# Patient Record
Sex: Female | Born: 1990 | Race: Black or African American | Hispanic: No | Marital: Single | Smoking: Former smoker
Health system: Southern US, Community
[De-identification: ages and names within clinical notes are randomized; demographics above are authoritative.]

## PROBLEM LIST (undated history)

## (undated) ENCOUNTER — Inpatient Hospital Stay (HOSPITAL_COMMUNITY): Payer: Self-pay

## (undated) DIAGNOSIS — O99119 Other diseases of the blood and blood-forming organs and certain disorders involving the immune mechanism complicating pregnancy, unspecified trimester: Secondary | ICD-10-CM

## (undated) DIAGNOSIS — G709 Myoneural disorder, unspecified: Secondary | ICD-10-CM

## (undated) DIAGNOSIS — D649 Anemia, unspecified: Secondary | ICD-10-CM

## (undated) DIAGNOSIS — J40 Bronchitis, not specified as acute or chronic: Secondary | ICD-10-CM

## (undated) DIAGNOSIS — D696 Thrombocytopenia, unspecified: Secondary | ICD-10-CM

## (undated) HISTORY — PX: NO PAST SURGERIES: SHX2092

## (undated) HISTORY — DX: Other diseases of the blood and blood-forming organs and certain disorders involving the immune mechanism complicating pregnancy, unspecified trimester: D69.6

## (undated) HISTORY — DX: Thrombocytopenia, unspecified: O99.119

---

## 2015-12-30 ENCOUNTER — Emergency Department (HOSPITAL_COMMUNITY)
Admission: EM | Admit: 2015-12-30 | Discharge: 2015-12-30 | Disposition: A | Discharge status: Home / Self Care | Payer: Medicaid Other | Attending: Emergency Medicine | Admitting: Emergency Medicine

## 2015-12-30 ENCOUNTER — Emergency Department (HOSPITAL_COMMUNITY): Payer: Medicaid Other

## 2015-12-30 ENCOUNTER — Encounter (HOSPITAL_COMMUNITY): Payer: Self-pay | Admitting: Neurology

## 2015-12-30 DIAGNOSIS — O26899 Other specified pregnancy related conditions, unspecified trimester: Secondary | ICD-10-CM

## 2015-12-30 DIAGNOSIS — O210 Mild hyperemesis gravidarum: Secondary | ICD-10-CM | POA: Diagnosis not present

## 2015-12-30 DIAGNOSIS — R109 Unspecified abdominal pain: Secondary | ICD-10-CM

## 2015-12-30 DIAGNOSIS — O209 Hemorrhage in early pregnancy, unspecified: Secondary | ICD-10-CM | POA: Diagnosis present

## 2015-12-30 DIAGNOSIS — O26851 Spotting complicating pregnancy, first trimester: Secondary | ICD-10-CM

## 2015-12-30 DIAGNOSIS — O219 Vomiting of pregnancy, unspecified: Secondary | ICD-10-CM

## 2015-12-30 DIAGNOSIS — Z3A01 Less than 8 weeks gestation of pregnancy: Secondary | ICD-10-CM | POA: Insufficient documentation

## 2015-12-30 DIAGNOSIS — O200 Threatened abortion: Secondary | ICD-10-CM | POA: Insufficient documentation

## 2015-12-30 LAB — HCG, QUANTITATIVE, PREGNANCY: hCG, Beta Chain, Quant, S: 94943 m[IU]/mL — ABNORMAL HIGH (ref <5)

## 2015-12-30 LAB — URINALYSIS, ROUTINE W REFLEX MICROSCOPIC
Glucose, UA: NEGATIVE mg/dL
Ketones, ur: 40 mg/dL — AB
LEUKOCYTES UA: NEGATIVE
NITRITE: NEGATIVE
PH: 6 (ref 5.0–8.0)
Protein, ur: 30 mg/dL — AB
SPECIFIC GRAVITY, URINE: 1.036 — AB (ref 1.005–1.030)

## 2015-12-30 LAB — BASIC METABOLIC PANEL
ANION GAP: 11 (ref 5–15)
BUN: 9 mg/dL (ref 6–20)
CHLORIDE: 104 mmol/L (ref 101–111)
CO2: 22 mmol/L (ref 22–32)
Calcium: 9.2 mg/dL (ref 8.9–10.3)
Creatinine, Ser: 0.7 mg/dL (ref 0.44–1.00)
GFR calc non Af Amer: >60 mL/min (ref >60)
Glucose, Bld: 83 mg/dL (ref 65–99)
POTASSIUM: 3.8 mmol/L (ref 3.5–5.1)
Sodium: 137 mmol/L (ref 135–145)

## 2015-12-30 LAB — CBC WITH DIFFERENTIAL/PLATELET
BASOS ABS: 0 10*3/uL (ref 0.0–0.1)
BASOS PCT: 0 %
Eosinophils Absolute: 0 10*3/uL (ref 0.0–0.7)
Eosinophils Relative: 1 %
HEMATOCRIT: 36.1 % (ref 36.0–46.0)
HEMOGLOBIN: 11.3 g/dL — AB (ref 12.0–15.0)
Lymphocytes Relative: 24 %
Lymphs Abs: 0.9 10*3/uL (ref 0.7–4.0)
MCH: 25.5 pg — ABNORMAL LOW (ref 26.0–34.0)
MCHC: 31.3 g/dL (ref 30.0–36.0)
MCV: 81.5 fL (ref 78.0–100.0)
MONOS PCT: 11 %
Monocytes Absolute: 0.4 10*3/uL (ref 0.1–1.0)
NEUTROS ABS: 2.4 10*3/uL (ref 1.7–7.7)
NEUTROS PCT: 64 %
Platelets: 124 10*3/uL — ABNORMAL LOW (ref 150–400)
RBC: 4.43 MIL/uL (ref 3.87–5.11)
RDW: 15.6 % — ABNORMAL HIGH (ref 11.5–15.5)
WBC: 3.8 10*3/uL — ABNORMAL LOW (ref 4.0–10.5)

## 2015-12-30 LAB — I-STAT BETA HCG BLOOD, ED (MC, WL, AP ONLY)

## 2015-12-30 LAB — URINE MICROSCOPIC-ADD ON

## 2015-12-30 LAB — WET PREP, GENITAL
Sperm: NONE SEEN
Trich, Wet Prep: NONE SEEN
YEAST WET PREP: NONE SEEN

## 2015-12-30 MED ORDER — PROMETHAZINE HCL 12.5 MG PO TABS: 12.5000 mg | ORAL_TABLET | Freq: Four times a day (QID) | ORAL | Status: DC | PRN

## 2015-12-30 MED ORDER — SODIUM CHLORIDE 0.9 % IV SOLN
INTRAVENOUS | Status: DC
  Administered 2015-12-30: 16:00:00 via INTRAVENOUS

## 2015-12-30 MED ORDER — METOCLOPRAMIDE HCL 5 MG/ML IJ SOLN
10.0000 mg | Freq: Once | INTRAMUSCULAR | Status: AC
  Administered 2015-12-30: 10 mg via INTRAVENOUS
  Filled 2015-12-30: qty 2

## 2015-12-30 NOTE — ED Provider Notes (Signed)
CSN: [id number]     Arrival date & time 12/30/15  1258 History  By signing my name below, I, [name], attest that this documentation has been prepared under the direction and in the presence of Kerrie BuffaloHope Neese, NP Electronically Signed: [name], ED Scribe. 12/30/2015. 3:53 PM.   Chief Complaint  Patient presents with  . Vaginal Bleeding      Patient is a 25 y.o. female presenting with vaginal bleeding. The history is provided by the patient. No language interpreter was used.  Vaginal Bleeding Quality:  Heavier than menses Severity:  Moderate Onset quality:  Sudden Duration:  1 day Timing:  Constant Progression:  Unchanged Chronicity:  New Menstrual history:  Missed period Possible pregnancy: yes   Context: at rest   Relieved by:  None tried Worsened by:  Nothing tried Ineffective treatments:  None tried Associated symptoms: abdominal pain, back pain and nausea   Associated symptoms: no dysuria   Risk factors: no new sexual partner, no STD and no STD exposure     HPI Comments: Barbara Cox is a 25 y.o. female who presents to the Emergency Department complaining of vaginal bleeding onset yesterday.  She notes that she took a pregnancy test that returned positive with her LMP being 11/24/15. Pt states that her period that she had on 11/24/15 seemed similar to her menstrual initially, but increased in bleeding. Pt notes that this is her 3rd pregnancy, and she has one 23 year old child. Pt has had one abortion following her first pregnancy. Pt notes that she is going to get an abortion for this pregnancy, due to her having intense pain with this pregnancy. Pt notes that she had used pills and nexplanon as her contraceptive measures. She notes that she plans to have contraceptive following her upcoming abortion. Pt has been with her current sexual partner for 6 months and denies and PMHx of STDs.  She states that she is having associated symptoms of sharp moderate left abdominal pain  radiating to her back, pressure while urinating, vomiting 5-6 episodes daily x 1 week, and nausea. She states that she has not tried any medications for the relief for her symptoms. She denies frequency, urgency, dysuria, and any other symptoms. Denies PMHx but she takes bipolar and depression medications. Pt doesn't have a doctor currently, due to just moving here. Pt last pap smear 1 year ago.    History reviewed. No pertinent past medical history. History reviewed. No pertinent past surgical history. No family history on file. Social History  Substance Use Topics  . Smoking status: Never Smoker   . Smokeless tobacco: None  . Alcohol Use: No   OB History    No data available     Review of Systems  Gastrointestinal: Positive for nausea, vomiting and abdominal pain.  Genitourinary: Positive for vaginal bleeding. Negative for dysuria, urgency and frequency.  Musculoskeletal: Positive for back pain.  All other systems reviewed and are negative.     Allergies  Peanuts  Home Medications   Prior to Admission medications   Medication Sig Start Date End Date Taking? Authorizing Provider  promethazine (PHENERGAN) 12.5 MG tablet Take 1 tablet (12.5 mg total) by mouth every 6 (six) hours as needed for nausea or vomiting. 12/30/15   Hope Orlene OchM Neese, NP   BP 102/66 mmHg  Pulse 68  Temp(Src) 99.1 F (37.3 C) (Oral)  Resp 18  SpO2 100%  LMP 11/24/2015 Physical Exam  Constitutional: She is oriented to person, place, and time.  She appears well-developed and well-nourished. No distress.  HENT:  Head: Normocephalic and atraumatic.  Right Ear: Tympanic membrane, external ear and ear canal normal.  Left Ear: Tympanic membrane, external ear and ear canal normal.  Mouth/Throat: Uvula is midline, oropharynx is clear and moist and mucous membranes are normal. No posterior oropharyngeal edema or posterior oropharyngeal erythema.  Eyes: EOM are normal.  Neck: Neck supple.  Cardiovascular: Normal  rate, regular rhythm and normal heart sounds.  Exam reveals no gallop and no friction rub.   No murmur heard. Pulmonary/Chest: Effort normal and breath sounds normal. No respiratory distress. She has no wheezes. She has no rales.  Abdominal: Soft. Bowel sounds are normal. There is tenderness in the left upper quadrant and left lower quadrant. There is guarding. There is no CVA tenderness.  Tenderness in the LLQ and LUQ. Guarding noted.   Genitourinary:  Chaperone present for exam External genitalia without lesions, bloody d/c vaginal vault, positive CMT, left adnexal tenderness.   Musculoskeletal: Normal range of motion.  Neurological: She is alert and oriented to person, place, and time.  Skin: Skin is warm and dry.  Psychiatric: She has a normal mood and affect. Her behavior is normal.  Nursing note and vitals reviewed.   ED Course  Procedures (including critical care time) DIAGNOSTIC STUDIES: Oxygen Saturation is 100% on RA, nl by my interpretation.    COORDINATION OF CARE: 3:50 PM Discussed treatment plan with pt at bedside which includes labs, UA, pelvic exam and pt agreed to plan. IV fluids and medication for nausea.   Diff Dx ectopic pregnancy vs SAB vs IUP Doubt torsion or appendicitis as WBC 3.8 and pain is on the left.   Labs Review Labs Reviewed  WET PREP, GENITAL - Abnormal; Notable for the following:    Clue Cells Wet Prep HPF POC PRESENT (*)    WBC, Wet Prep HPF POC MANY (*)    All other components within normal limits  CBC WITH DIFFERENTIAL/PLATELET - Abnormal; Notable for the following:    WBC 3.8 (*)    Hemoglobin 11.3 (*)    MCH 25.5 (*)    RDW 15.6 (*)    Platelets 124 (*)    All other components within normal limits  URINALYSIS, ROUTINE W REFLEX MICROSCOPIC (NOT AT [facility]) - Abnormal; Notable for the following:    Specific Gravity, Urine 1.036 (*)    Hgb urine dipstick LARGE (*)    Bilirubin Urine SMALL (*)    Ketones, ur 40 (*)    Protein, ur 30 (*)     All other components within normal limits  URINE MICROSCOPIC-ADD ON - Abnormal; Notable for the following:    Squamous Epithelial / LPF 6-30 (*)    Bacteria, UA FEW (*)    All other components within normal limits  HCG, QUANTITATIVE, PREGNANCY - Abnormal; Notable for the following:    hCG, Beta Chain, Quant, S 81191 (*)    All other components within normal limits  I-STAT BETA HCG BLOOD, ED ([facility], [facility], [facility] ONLY) - Abnormal; Notable for the following:    I-stat hCG, quantitative >2000.0 (*)    All other components within normal limits  BASIC METABOLIC PANEL  RPR  HIV ANTIBODY (ROUTINE TESTING)  GC/CHLAMYDIA PROBE AMP ([place]) NOT AT [facility]    Imaging Review US Ob Comp Less 14 Wks  12/30/2015  CLINICAL DATA:  Left adnexal pain for 1 day.  The spotting. EXAM: OBSTETRIC <14 WK [place] AND TRANSVAGINAL OB US TECHNIQUE:  Both transabdominal and transvaginal ultrasound examinations were performed for complete evaluation of the gestation as well as the maternal uterus, adnexal regions, and pelvic cul-de-sac. Transvaginal technique was performed to assess early pregnancy. COMPARISON:  None. FINDINGS: Intrauterine gestational sac: Visualized/normal in shape. Yolk sac:  Present. Embryo:  Present. Cardiac Activity: Present. Heart Rate: 140  bpm CRL:  7.4  mm   6 w   for d                  [place] EDC: 08/20/2016 Subchorionic hemorrhage:  None visualized. Maternal uterus/adnexae: Trace pelvic free fluid. IMPRESSION: Single living intrauterine gestation as above. Electronically Signed   By: Sebastian Ache M.D.   On: 12/30/2015 18:13   US Ob Transvaginal  12/30/2015  CLINICAL DATA:  Left adnexal pain for 1 day.  The spotting. EXAM: OBSTETRIC <14 WK [place] AND TRANSVAGINAL OB US TECHNIQUE: Both transabdominal and transvaginal ultrasound examinations were performed for complete evaluation of the gestation as well as the maternal uterus, adnexal regions, and pelvic cul-de-sac. Transvaginal technique was performed to assess early  pregnancy. COMPARISON:  None. FINDINGS: Intrauterine gestational sac: Visualized/normal in shape. Yolk sac:  Present. Embryo:  Present. Cardiac Activity: Present. Heart Rate: 140  bpm CRL:  7.4  mm   6 w   for d                  [place] EDC: 08/20/2016 Subchorionic hemorrhage:  None visualized. Maternal uterus/adnexae: Trace pelvic free fluid. IMPRESSION: Single living intrauterine gestation as above. Electronically Signed   By: Sebastian Ache M.D.   On: 12/30/2015 18:13   I have personally reviewed and evaluated these images and lab results as part of my medical decision-making.   MDM  25 y.o. female with lower abdominal cramping and n/v in early pregnancy stable for d/c with viable IUP identified on ultrasound. Feeling better after fluids and medication for n/v. Will give Rx for n/v and patient to take tylenol as needed for pain and f/u with OB of choice. She will go to [facility] hospital for worsening symptoms.  Final diagnoses:  Abdominal pain in pregnancy  Nausea and vomiting in pregnancy prior to [redacted] weeks gestation  Threatened miscarriage in early pregnancy   I personally performed the services described in this documentation, which was scribed in my presence. The recorded information has been reviewed and is accurate.    [facility] Orlene Och, NP 12/30/15 [address] [place], NP 12/30/15 1610  Rolland Porter, MD 01/06/16 [id number]

## 2015-12-30 NOTE — ED Notes (Addendum)
LMP 2/13, took pregnancy test and was positive. Since yesterday has had heavy vaginal bleeding, c/o lower abd pain, is vomiting. Is still bleeding, but is not as heavy. No clots, has changed her pad twice so far today. Thinks she is about [redacted] weeks pregnant. C/o bilateral  lower abd pain radiating around to her back.

## 2015-12-31 LAB — GC/CHLAMYDIA PROBE AMP (~~LOC~~) NOT AT ARMC
CHLAMYDIA, DNA PROBE: NEGATIVE
NEISSERIA GONORRHEA: NEGATIVE

## 2015-12-31 LAB — HIV ANTIBODY (ROUTINE TESTING W REFLEX): HIV Screen 4th Generation wRfx: NONREACTIVE

## 2015-12-31 LAB — RPR: RPR: NONREACTIVE

## 2016-12-11 ENCOUNTER — Emergency Department (HOSPITAL_COMMUNITY)
Admission: EM | Admit: 2016-12-11 | Discharge: 2016-12-11 | Disposition: A | Discharge status: Home / Self Care | Payer: Medicaid Other | Attending: Emergency Medicine | Admitting: Emergency Medicine

## 2016-12-11 ENCOUNTER — Encounter (HOSPITAL_COMMUNITY): Payer: Self-pay | Admitting: Emergency Medicine

## 2016-12-11 DIAGNOSIS — Z5321 Procedure and treatment not carried out due to patient leaving prior to being seen by health care provider: Secondary | ICD-10-CM | POA: Diagnosis not present

## 2016-12-11 DIAGNOSIS — T7840XA Allergy, unspecified, initial encounter: Secondary | ICD-10-CM | POA: Diagnosis not present

## 2016-12-11 NOTE — ED Triage Notes (Signed)
Pt reports having a peanut allergy and 2 hours ago ate a granola bar with peanuts in it. Pt used friends EPI pen at 520. Pt states she feels a sharp pain in throat. No wheezing or anigoedema noted. Able to speech in complete sentences.

## 2016-12-11 NOTE — ED Notes (Signed)
Pt still not seen in the ED lobby. LWBS

## 2016-12-11 NOTE — ED Notes (Signed)
Pt stated she needed to go and check on her son. This RN explained the importance of staying and being monitored after having her EPI pen and eating peanuts. I informed pt I would go and check on a room for her and come back to get her. On arrival back to waiting area pt is not in lobby. Will continue to look for pt. Pt may have LWBS.

## 2018-04-19 ENCOUNTER — Encounter (HOSPITAL_COMMUNITY): Payer: Self-pay | Admitting: *Deleted

## 2018-04-19 ENCOUNTER — Other Ambulatory Visit: Payer: Self-pay

## 2018-04-19 ENCOUNTER — Emergency Department (HOSPITAL_COMMUNITY)
Admission: EM | Admit: 2018-04-19 | Discharge: 2018-04-19 | Disposition: A | Discharge status: Home / Self Care | Payer: Medicaid Other | Attending: Emergency Medicine | Admitting: Emergency Medicine

## 2018-04-19 DIAGNOSIS — M545 Low back pain, unspecified: Secondary | ICD-10-CM

## 2018-04-19 DIAGNOSIS — Z3A01 Less than 8 weeks gestation of pregnancy: Secondary | ICD-10-CM

## 2018-04-19 DIAGNOSIS — O26891 Other specified pregnancy related conditions, first trimester: Secondary | ICD-10-CM | POA: Insufficient documentation

## 2018-04-19 DIAGNOSIS — O9989 Other specified diseases and conditions complicating pregnancy, childbirth and the puerperium: Secondary | ICD-10-CM | POA: Diagnosis not present

## 2018-04-19 LAB — URINALYSIS, ROUTINE W REFLEX MICROSCOPIC
Bilirubin Urine: NEGATIVE
Glucose, UA: NEGATIVE mg/dL
Hgb urine dipstick: NEGATIVE
Ketones, ur: NEGATIVE mg/dL
Leukocytes, UA: NEGATIVE
NITRITE: NEGATIVE
PROTEIN: NEGATIVE mg/dL
SPECIFIC GRAVITY, URINE: 1.016 (ref 1.005–1.030)
pH: 6 (ref 5.0–8.0)

## 2018-04-19 LAB — I-STAT BETA HCG BLOOD, ED (MC, WL, AP ONLY): HCG, QUANTITATIVE: 826.4 m[IU]/mL — AB (ref <5)

## 2018-04-19 MED ORDER — PRENATAL COMPLETE 14-0.4 MG PO TABS: 1.0000 | ORAL_TABLET | Freq: Every day | ORAL | 0 refills | Status: DC

## 2018-04-19 NOTE — ED Triage Notes (Addendum)
Pt is here for feeling sick.  Pt reports lower back pain since last week and nausea and exhaustion.  No pain or burning with urination.  No fever or chills.  No weakness or numbness.  Pt reports that she is pregnant, confirmed by home pregnancy test.  LMP last month.

## 2018-04-19 NOTE — ED Provider Notes (Signed)
[facility] EMERGENCY DEPARTMENT Provider Note   CSN: [id number] Arrival date & time: 04/19/18  1207     History   Chief Complaint Chief Complaint  Patient presents with  . Illness    HPI Barbara Cox is a 27 y.o. female.  HPI   27 year old female presents today with complaints of right lower back pain. Patient notes approximately one month ago she was moving furniture around her house. Shortly after she developed pain in the right lower lumbar region. She notes pain does not radiate, denies any abdominal pain or fevers, denies any urinary symptoms. Patient also reports she is recently pregnant with the last mesh cycle on 11/20/2017. She denies any vaginal bleeding or discharge. Patient she was using BC powders as needed for pain.    History reviewed. No pertinent past medical history.  There are no active problems to display for this patient.   History reviewed. No pertinent surgical history.   OB History    Gravida  1   Para      Term      Preterm      AB      Living        SAB      TAB      Ectopic      Multiple      Live Births               Home Medications    Prior to Admission medications   Medication Sig Start Date End Date Taking? Authorizing Provider  Prenatal Vit-Fe Fumarate-FA (PRENATAL COMPLETE) 14-0.4 MG TABS Take 1 tablet by mouth daily. 04/19/18   Rameses Ou, Tinnie Gens, PA-C  promethazine (PHENERGAN) 12.5 MG tablet Take 1 tablet (12.5 mg total) by mouth every 6 (six) hours as needed for nausea or vomiting. 12/30/15   Janne Napoleon, NP    Family History No family history on file.  Social History Social History   Tobacco Use  . Smoking status: Never Smoker  . Smokeless tobacco: Never Used  Substance Use Topics  . Alcohol use: No  . Drug use: Never     Allergies   Peanuts [peanut oil]   Review of Systems Review of Systems  All other systems reviewed and are negative.  Physical Exam Updated Vital  Signs BP 110/66 (BP Location: Right Arm)   Pulse 81   Temp 98.9 F (37.2 C) (Oral)   Resp 16   Ht 5\' 1"  (1.549 m)   Wt 72.6 kg (160 lb)   LMP 03/20/2018 Comment: pt states that her perion 6/10 was light. one before 5/16  SpO2 100%   BMI 30.23 kg/m   Physical Exam  Constitutional: She is oriented to person, place, and time. She appears well-developed and well-nourished.  HENT:  Head: Normocephalic and atraumatic.  Eyes: Pupils are equal, round, and reactive to light. Conjunctivae are normal. Right eye exhibits no discharge. Left eye exhibits no discharge. No scleral icterus.  Neck: Normal range of motion. No JVD present. No tracheal deviation present.  Pulmonary/Chest: Effort normal. No stridor.  Abdominal:  Abdomen soft nontender  Musculoskeletal:  Exquisite tenderness palpation of right lateral lumbar musculature, distal sensation strength function intact  Neurological: She is alert and oriented to person, place, and time. Coordination normal.  Psychiatric: She has a normal mood and affect. Her behavior is normal. Judgment and thought content normal.  Nursing note and vitals reviewed.   ED Treatments / Results  Labs (all labs ordered are  listed, but only abnormal results are displayed) Labs Reviewed  URINALYSIS, ROUTINE W REFLEX MICROSCOPIC - Abnormal; Notable for the following components:      Result Value   APPearance HAZY (*)    All other components within normal limits  I-STAT BETA HCG BLOOD, ED ([facility], [facility], [facility] ONLY) - Abnormal; Notable for the following components:   I-stat hCG, quantitative 826.4 (*)    All other components within normal limits  I-STAT BETA HCG BLOOD, ED ([facility], [facility], [facility] ONLY)    EKG None  Radiology No results found.  Procedures Procedures (including critical care time)  Medications Ordered in ED Medications - No data to display   Initial Impression / Assessment and Plan / ED Course  I have reviewed the triage vital signs and the nursing  notes.  Pertinent labs & imaging results that were available during my care of the patient were reviewed by me and considered in my medical decision making (see chart for details).  Clinical Course as of Apr 19 1504  Wed Apr 19, 2018  1409 Comment 3:        [NL]  1409 Comment 3:        [NL]    Clinical Course User Index [NL] Laurice RecordLandry, Naomi P, Student-PA    Labs: I-STAT beta-hCG, urinalysis  Imaging:  Consults:  Therapeutics:  Discharge Meds:   Assessment/Plan: 27 year old female presents today with likely muscular back pain. Patient has had this for approximately one month, this was prior to her pregnancy. Patient has elevation in hCG at 826 today, no abdominal pain, no vaginal bleeding or discharge. Patient started on prenatals encouraged to use Tylenol as needed for pain, follow up as an outpatient with OB. Patient verbalized understanding and agreement to today's plan.   Final Clinical Impressions(s) / ED Diagnoses   Final diagnoses:  Acute right-sided low back pain without sciatica  Less than [redacted] weeks gestation of pregnancy    ED Discharge Orders        Ordered    Prenatal Vit-Fe Fumarate-FA (PRENATAL COMPLETE) 14-0.4 MG TABS  Daily     04/19/18 1502       Eyvonne MechanicHedges, Honore Wipperfurth, PA-C 04/19/18 1506    Melene PlanFloyd, Dan, DO 04/19/18 1641

## 2018-04-19 NOTE — Discharge Instructions (Signed)
Please read attached information. If you experience any new or worsening signs or symptoms please return to the emergency room for evaluation. Please follow-up with your primary care provider or specialist as discussed. Please use medication prescribed only as directed and discontinue taking if you have any concerning signs or symptoms.   °

## 2018-05-17 ENCOUNTER — Encounter: Payer: Medicaid Other | Admitting: Obstetrics

## 2018-06-06 ENCOUNTER — Ambulatory Visit (INDEPENDENT_AMBULATORY_CARE_PROVIDER_SITE_OTHER): Payer: Medicaid Other | Admitting: Obstetrics

## 2018-06-06 ENCOUNTER — Other Ambulatory Visit (HOSPITAL_COMMUNITY)
Admission: RE | Admit: 2018-06-06 | Discharge: 2018-06-06 | Disposition: A | Discharge status: Home / Self Care | Payer: Medicaid Other | Source: Ambulatory Visit | Attending: Obstetrics | Admitting: Obstetrics

## 2018-06-06 ENCOUNTER — Encounter: Payer: Self-pay | Admitting: Obstetrics

## 2018-06-06 VITALS — BP 102/60 | HR 81 | Wt 163.7 lb

## 2018-06-06 DIAGNOSIS — Z3481 Encounter for supervision of other normal pregnancy, first trimester: Secondary | ICD-10-CM

## 2018-06-06 DIAGNOSIS — Z01419 Encounter for gynecological examination (general) (routine) without abnormal findings: Secondary | ICD-10-CM | POA: Diagnosis not present

## 2018-06-06 DIAGNOSIS — Z3A11 11 weeks gestation of pregnancy: Secondary | ICD-10-CM | POA: Insufficient documentation

## 2018-06-06 DIAGNOSIS — Z3480 Encounter for supervision of other normal pregnancy, unspecified trimester: Secondary | ICD-10-CM | POA: Diagnosis not present

## 2018-06-06 DIAGNOSIS — O219 Vomiting of pregnancy, unspecified: Secondary | ICD-10-CM

## 2018-06-06 DIAGNOSIS — N898 Other specified noninflammatory disorders of vagina: Secondary | ICD-10-CM

## 2018-06-06 MED ORDER — DOXYLAMINE-PYRIDOXINE 10-10 MG PO TBEC: DELAYED_RELEASE_TABLET | ORAL | 5 refills | Status: DC

## 2018-06-06 NOTE — Progress Notes (Signed)
Subjective:  Barbara Cox is a 27 y.o. G3P1011 at 3580w6d being seen today for ongoing prenatal care.  She is currently monitored for the following issues for this low-risk pregnancy and has Supervision of other normal pregnancy, antepartum on their problem list.  Patient reports backache and round ligament-like pains.  Contractions: Irritability.  .   . Denies leaking of fluid.   The following portions of the patient's history were reviewed and updated as appropriate: allergies, current medications, past family history, past medical history, past social history, past surgical history and problem list. Problem list updated.  Objective:   Vitals:   06/06/18 1045  BP: 102/60  Pulse: 81  Weight: 163 lb 11.2 oz (74.3 kg)    Fetal Status: Fetal Heart Rate (bpm): 150         General:  Alert, oriented and cooperative. Patient is in no acute distress.  Skin: Skin is warm and dry. No rash noted.   Cardiovascular: Normal heart rate noted  Respiratory: Normal respiratory effort, no problems with respiration noted  Abdomen: Soft, gravid, appropriate for gestational age. Pain/Pressure: Present     Pelvic:  Cervical exam deferred        Extremities: Normal range of motion.  Edema: None  Mental Status: Normal mood and affect. Normal behavior. Normal judgment and thought content.   Urinalysis:      Assessment and Plan:  Pregnancy: G3P1011 at 2180w6d  1. Supervision of other normal pregnancy, antepartum Rx: - Cytology - PAP - Hemoglobinopathy evaluation - SMN1 Copy Number Analysis  2. Nausea and vomiting during pregnancy prior to [redacted] weeks gestation Rx: - Obstetric Panel, Including HIV - Culture, OB Urine - Doxylamine-Pyridoxine (DICLEGIS) 10-10 MG TBEC; 1 tab in AM, 1 tab mid afternoon 2 tabs at bedtime. Max dose 4 tabs daily.  Dispense: 100 tablet; Refill: 5  3. Vaginal discharge Rx: - Cervicovaginal ancillary only  Preterm labor symptoms and general obstetric precautions including but  not limited to vaginal bleeding, contractions, leaking of fluid and fetal movement were reviewed in detail with the patient. Please refer to After Visit Summary for other counseling recommendations.  Return in about 4 weeks (around 07/04/2018) for ROB.   Brock BadHarper, Charles A, MD

## 2018-06-06 NOTE — Patient Instructions (Signed)
Morning Sickness Morning sickness is when you feel sick to your stomach (nauseous) during pregnancy. This nauseous feeling may or may not come with vomiting. It often occurs in the morning but can be a problem any time of day. Morning sickness is most common during the first trimester, but it may continue throughout pregnancy. While morning sickness is unpleasant, it is usually harmless unless you develop severe and continual vomiting (hyperemesis gravidarum). This condition requires more intense treatment. What are the causes? The cause of morning sickness is not completely known but seems to be related to normal hormonal changes that occur in pregnancy. What increases the risk? You are at greater risk if you:  Experienced nausea or vomiting before your pregnancy.  Had morning sickness during a previous pregnancy.  Are pregnant with more than one baby, such as twins.  How is this treated? Do not use any medicines (prescription, over-the-counter, or herbal) for morning sickness without first talking to your health care provider. Your health care provider may prescribe or recommend:  Vitamin B6 supplements.  Anti-nausea medicines.  The herbal medicine ginger.  Follow these instructions at home:  Only take over-the-counter or prescription medicines as directed by your health care provider.  Taking multivitamins before getting pregnant can prevent or decrease the severity of morning sickness in most women.  Eat a piece of dry toast or unsalted crackers before getting out of bed in the morning.  Eat five or six small meals a day.  Eat dry and bland foods (rice, baked potato). Foods high in carbohydrates are often helpful.  Do not drink liquids with your meals. Drink liquids between meals.  Avoid greasy, fatty, and spicy foods.  Get someone to cook for you if the smell of any food causes nausea and vomiting.  If you feel nauseous after taking prenatal vitamins, take the vitamins at  night or with a snack.  Snack on protein foods (nuts, yogurt, cheese) between meals if you are hungry.  Eat unsweetened gelatins for desserts.  Wearing an acupressure wristband (worn for sea sickness) may be helpful.  Acupuncture may be helpful.  Do not smoke.  Get a humidifier to keep the air in your house free of odors.  Get plenty of fresh air. Contact a health care provider if:  Your home remedies are not working, and you need medicine.  You feel dizzy or lightheaded.  You are losing weight. Get help right away if:  You have persistent and uncontrolled nausea and vomiting.  You pass out (faint). This information is not intended to replace advice given to you by your health care provider. Make sure you discuss any questions you have with your health care provider. Document Released: 11/18/2006 Document Revised: 03/04/2016 Document Reviewed: 03/14/2013 Elsevier Interactive Patient Education  2017 Elsevier Inc.  Hyperemesis Gravidarum Hyperemesis gravidarum is a severe form of nausea and vomiting that happens during pregnancy. Hyperemesis is worse than morning sickness. It may cause you to have nausea or vomiting all day for many days. It may keep you from eating and drinking enough food and liquids. Hyperemesis usually occurs during the first half (the first 20 weeks) of pregnancy. It often goes away once a woman is in her second half of pregnancy. However, sometimes hyperemesis continues through an entire pregnancy. What are the causes? The cause of this condition is not known. It may be related to changes in chemicals (hormones) in the body during pregnancy, such as the high level of pregnancy hormone (human chorionic gonadotropin) or  the increase in the female sex hormone (estrogen). What are the signs or symptoms? Symptoms of this condition include:  Severe nausea and vomiting.  Nausea that does not go away.  Vomiting that does not allow you to keep any food  down.  Weight loss.  Body fluid loss (dehydration).  Having no desire to eat, or not liking food that you have previously enjoyed.  How is this diagnosed? This condition may be diagnosed based on:  A physical exam.  Your medical history.  Your symptoms.  Blood tests.  Urine tests.  How is this treated? This condition may be managed with medicine. If medicines to do not help relieve nausea and vomiting, you may need to receive fluids through an IV tube at the hospital. Follow these instructions at home:  Take over-the-counter and prescription medicines only as told by your health care provider.  Avoid iron pills and multivitamins that contain iron for the first 3-4 months of pregnancy. If you take prescription iron pills, do not stop taking them unless your health care provider approves.  Take the following actions to help prevent nausea and vomiting: ? In the morning, before getting out of bed, try eating a couple of dry crackers or a piece of toast. ? Avoid foods and smells that upset your stomach. Fatty and spicy foods may make nausea worse. ? Eat 5-6 small meals a day. ? Do not drink fluids while eating meals. Drink between meals. ? Eat or suck on things that have ginger in them. Ginger can help relieve nausea. ? Avoid food preparation. The smell of food can spoil your appetite or trigger nausea.  Follow instructions from your health care provider about eating or drinking restrictions.  For snacks, eat high-protein foods, such as cheese.  Keep all follow-up and pre-birth (prenatal) visits as told by your health care provider. This is important. Contact a health care provider if:  You have pain in your abdomen.  You have a severe headache.  You have vision problems.  You are losing weight. Get help right away if:  You cannot drink fluids without vomiting.  You vomit blood.  You have constant nausea and vomiting.  You are very weak.  You are very  thirsty.  You feel dizzy.  You faint.  You have a fever or other symptoms that last for more than 2-3 days.  You have a fever and your symptoms suddenly get worse. Summary  Hyperemesis gravidarum is a severe form of nausea and vomiting that happens during pregnancy.  Making some changes to your eating habits may help relieve nausea and vomiting.  This condition may be managed with medicine.  If medicines to do not help relieve nausea and vomiting, you may need to receive fluids through an IV tube at the hospital. This information is not intended to replace advice given to you by your health care provider. Make sure you discuss any questions you have with your health care provider. Document Released: 09/27/2005 Document Revised: 05/26/2016 Document Reviewed: 05/26/2016 Elsevier Interactive Patient Education  2017 Elsevier Inc.  Eating Plan for Hyperemesis Gravidarum Hyperemesis gravidarum is a severe form of morning sickness. Because this condition causes severe nausea and vomiting, it can lead to dehydration, malnutrition, and weight loss. One way to lessen the symptoms of nausea and vomiting is to follow the eating plan for hyperemesis gravidarum. It is often used along with prescribed medicines to control your symptoms. What can I do to relieve my symptoms? Listen to your body.  Everyone is different and has different preferences. Find what works best for you. Take any of the following actions that are helpful to you:  Eat and drink slowly.  Eat 5-6 small meals daily instead of 3 large meals.  Eat crackers before you get out of bed in the morning.  Try having a snack in the middle of the night.  Starchy foods are usually tolerated well. Examples include cereal, toast, bread, potatoes, pasta, rice, and pretzels.  Ginger may help with nausea. Add  tsp ground ginger to hot tea or choose ginger tea.  Try drinking 100% fruit juice or an electrolyte drink. An electrolyte drink  contains sodium, potassium, and chloride.  Continue to take your prenatal vitamins as told by your health care provider. If you are having trouble taking your prenatal vitamins, talk with your health care provider about different options.  Include at least 1 serving of protein with your meals and snacks. Protein options include meats or poultry, beans, nuts, eggs, and yogurt. Try eating a protein-rich snack before bed. Examples of these snacks include cheese and crackers or half of a peanut butter or [place] sandwich.  Consider eliminating foods that trigger your symptoms. These may include spicy foods, coffee, high-fat foods, very sweet foods, and acidic foods.  Try meals that have more protein combined with bland, salty, lower-fat, and dry foods, such as nuts, seeds, pretzels, crackers, and cereal.  Talk with your healthcare provider about starting a supplement of vitamin B6.  Have fluids that are cold, clear, and carbonated or sour. Examples include lemonade, ginger ale, lemon-lime soda, ice water, and sparkling water.  Try lemon or mint tea.  Try brushing your teeth or using a mouth rinse after meals.  What should I avoid to reduce my symptoms? Avoiding some of the following things may help reduce your symptoms.  Foods with strong smells. Try eating meals in well-ventilated areas that are free of odors.  Drinking water or other beverages with meals. Try not to drink anything during the 30 minutes before and after your meals.  Drinking more than 1 cup of fluid at a time. Sometimes using a straw helps.  Fried or high-fat foods, such as butter and cream sauces.  Spicy foods.  Skipping meals as best as you can. Nausea can be more intense on an empty stomach. If you cannot tolerate food at that time, do not force it. Try sucking on ice chips or other frozen items, and make up for missed calories later.  Lying down within 2 hours after eating.  Environmental triggers. These may  include smoky rooms, closed spaces, rooms with strong smells, warm or humid places, overly loud and noisy rooms, and rooms with motion or flickering lights.  Quick and sudden changes in your movement.  This information is not intended to replace advice given to you by your health care provider. Make sure you discuss any questions you have with your health care provider. Document Released: 07/25/2007 Document Revised: 05/26/2016 Document Reviewed: 04/27/2016 Elsevier Interactive Patient Education  [employer]2018 Elsevier Inc.
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
## 2018-06-06 NOTE — Addendum Note (Signed)
Addended by: Natale MilchSTALLING, BRITTANY D on: 06/06/2018 01:45 PM   Modules accepted: Orders

## 2018-06-07 ENCOUNTER — Other Ambulatory Visit: Payer: Self-pay | Admitting: Obstetrics

## 2018-06-07 DIAGNOSIS — B9689 Other specified bacterial agents as the cause of diseases classified elsewhere: Secondary | ICD-10-CM

## 2018-06-07 DIAGNOSIS — N760 Acute vaginitis: Principal | ICD-10-CM

## 2018-06-07 LAB — CERVICOVAGINAL ANCILLARY ONLY
BACTERIAL VAGINITIS: POSITIVE — AB
CANDIDA VAGINITIS: NEGATIVE
Chlamydia: NEGATIVE
NEISSERIA GONORRHEA: NEGATIVE
Trichomonas: NEGATIVE

## 2018-06-07 MED ORDER — CLINDAMYCIN HCL 300 MG PO CAPS: 300.0000 mg | ORAL_CAPSULE | Freq: Three times a day (TID) | ORAL | 0 refills | Status: DC

## 2018-06-08 LAB — CYTOLOGY - PAP: DIAGNOSIS: NEGATIVE

## 2018-06-09 LAB — URINE CULTURE, OB REFLEX

## 2018-06-09 LAB — CULTURE, OB URINE

## 2018-06-14 ENCOUNTER — Telehealth: Payer: Self-pay

## 2018-06-14 NOTE — Telephone Encounter (Signed)
Returned call, and advised of lab results.

## 2018-06-17 LAB — SMN1 COPY NUMBER ANALYSIS (SMA CARRIER SCREENING)

## 2018-06-17 LAB — OBSTETRIC PANEL, INCLUDING HIV
ANTIBODY SCREEN: NEGATIVE
BASOS: 0 %
Basophils Absolute: 0 10*3/uL (ref 0.0–0.2)
EOS (ABSOLUTE): 0 10*3/uL (ref 0.0–0.4)
EOS: 1 %
HEMATOCRIT: 34.4 % (ref 34.0–46.6)
HEP B S AG: NEGATIVE
HIV Screen 4th Generation wRfx: NONREACTIVE
Hemoglobin: 11.3 g/dL (ref 11.1–15.9)
IMMATURE GRANS (ABS): 0 10*3/uL (ref 0.0–0.1)
Immature Granulocytes: 0 %
LYMPHS: 20 %
Lymphocytes Absolute: 1 10*3/uL (ref 0.7–3.1)
MCH: 26.8 pg (ref 26.6–33.0)
MCHC: 32.8 g/dL (ref 31.5–35.7)
MCV: 82 fL (ref 79–97)
MONOCYTES: 13 %
Monocytes Absolute: 0.7 10*3/uL (ref 0.1–0.9)
Neutrophils Absolute: 3.2 10*3/uL (ref 1.4–7.0)
Neutrophils: 66 %
Platelets: 177 10*3/uL (ref 150–450)
RBC: 4.21 x10E6/uL (ref 3.77–5.28)
RDW: 17.4 % — ABNORMAL HIGH (ref 12.3–15.4)
RH TYPE: NEGATIVE
RPR Ser Ql: NONREACTIVE
RUBELLA: 1.63 {index} (ref >0.99)
WBC: 5 10*3/uL (ref 3.4–10.8)

## 2018-06-17 LAB — HEMOGLOBINOPATHY EVALUATION
HEMOGLOBIN F QUANTITATION: 0 % (ref 0.0–2.0)
HGB A: 97.7 % (ref 96.4–98.8)
HGB C: 0 %
HGB S: 0 %
HGB VARIANT: 0 %
Hemoglobin A2 Quantitation: 2.3 % (ref 1.8–3.2)

## 2018-07-04 ENCOUNTER — Ambulatory Visit (INDEPENDENT_AMBULATORY_CARE_PROVIDER_SITE_OTHER): Payer: Medicaid Other | Admitting: Obstetrics

## 2018-07-04 ENCOUNTER — Other Ambulatory Visit: Payer: Self-pay

## 2018-07-04 ENCOUNTER — Encounter (HOSPITAL_COMMUNITY): Payer: Self-pay

## 2018-07-04 ENCOUNTER — Encounter: Payer: Self-pay | Admitting: Obstetrics

## 2018-07-04 ENCOUNTER — Inpatient Hospital Stay (HOSPITAL_COMMUNITY)
Admission: AD | Admit: 2018-07-04 | Discharge: 2018-07-04 | Disposition: A | Discharge status: Home / Self Care | Payer: Medicaid Other | Source: Ambulatory Visit | Attending: Family Medicine | Admitting: Family Medicine

## 2018-07-04 VITALS — BP 116/62 | HR 76 | Wt 160.2 lb

## 2018-07-04 DIAGNOSIS — Z6791 Unspecified blood type, Rh negative: Secondary | ICD-10-CM | POA: Insufficient documentation

## 2018-07-04 DIAGNOSIS — O4692 Antepartum hemorrhage, unspecified, second trimester: Secondary | ICD-10-CM | POA: Diagnosis not present

## 2018-07-04 DIAGNOSIS — O209 Hemorrhage in early pregnancy, unspecified: Secondary | ICD-10-CM | POA: Insufficient documentation

## 2018-07-04 DIAGNOSIS — O26852 Spotting complicating pregnancy, second trimester: Secondary | ICD-10-CM | POA: Diagnosis present

## 2018-07-04 DIAGNOSIS — O99332 Smoking (tobacco) complicating pregnancy, second trimester: Secondary | ICD-10-CM | POA: Diagnosis not present

## 2018-07-04 DIAGNOSIS — Z3A15 15 weeks gestation of pregnancy: Secondary | ICD-10-CM | POA: Diagnosis not present

## 2018-07-04 DIAGNOSIS — G8929 Other chronic pain: Secondary | ICD-10-CM

## 2018-07-04 DIAGNOSIS — Z3480 Encounter for supervision of other normal pregnancy, unspecified trimester: Secondary | ICD-10-CM

## 2018-07-04 DIAGNOSIS — O26899 Other specified pregnancy related conditions, unspecified trimester: Secondary | ICD-10-CM | POA: Insufficient documentation

## 2018-07-04 DIAGNOSIS — O26893 Other specified pregnancy related conditions, third trimester: Secondary | ICD-10-CM | POA: Diagnosis not present

## 2018-07-04 DIAGNOSIS — M545 Low back pain: Secondary | ICD-10-CM

## 2018-07-04 LAB — URINALYSIS, ROUTINE W REFLEX MICROSCOPIC
Bacteria, UA: NONE SEEN
Bilirubin Urine: NEGATIVE
Glucose, UA: NEGATIVE mg/dL
KETONES UR: 20 mg/dL — AB
Leukocytes, UA: NEGATIVE
Nitrite: NEGATIVE
PH: 6 (ref 5.0–8.0)
Protein, ur: NEGATIVE mg/dL
Specific Gravity, Urine: 1.014 (ref 1.005–1.030)

## 2018-07-04 LAB — ABO/RH: ABO/RH(D): O NEG

## 2018-07-04 MED ORDER — COMFORT FIT MATERNITY SUPP SM MISC: 0 refills | Status: DC

## 2018-07-04 MED ORDER — RHO D IMMUNE GLOBULIN 1500 UNIT/2ML IJ SOSY
300.0000 ug | PREFILLED_SYRINGE | Freq: Once | INTRAMUSCULAR | Status: AC
  Administered 2018-07-04: 300 ug via INTRAMUSCULAR
  Filled 2018-07-04: qty 2

## 2018-07-04 MED ORDER — VITAFOL ULTRA 29-0.6-0.4-200 MG PO CAPS: 1.0000 | ORAL_CAPSULE | Freq: Every day | ORAL | 4 refills | Status: DC

## 2018-07-04 NOTE — MAU Note (Signed)
Went to appt this morning, everything was fine.  Got some bad news.  Went to the bathroom later and had some bleeding, passed a small clot. Still bleeding. Little pains in her back, not a new problem.

## 2018-07-04 NOTE — Discharge Instructions (Signed)
Vaginal Bleeding During Pregnancy, Second Trimester A small amount of bleeding (spotting) from the vagina is common in pregnancy. Sometimes the bleeding is normal and is not a problem, and sometimes it is a sign of something serious. Be sure to tell your doctor about any bleeding from your vagina right away. Follow these instructions at home:  Watch your condition for any changes.  Follow your doctor's instructions about how active you can be.  If you are on bed rest: ? You may need to stay in bed and only get up to use the bathroom. ? You may be allowed to do some activities. ? If you need help, make plans for someone to help you.  Write down: ? The number of pads you use each day. ? How often you change pads. ? How soaked (saturated) your pads are.  Do not use tampons.  Do not douche.  Do not have sex or orgasms until your doctor says it is okay.  If you pass any tissue from your vagina, save the tissue so you can show it to your doctor.  Only take medicines as told by your doctor.  Do not take aspirin because it can make you bleed.  Do not exercise, lift heavy weights, or do any activities that take a lot of energy and effort unless your doctor says it is okay.  Keep all follow-up visits as told by your doctor. Contact a doctor if:  You bleed from your vagina.  You have cramps.  You have labor pains.  You have a fever that does not go away after you take medicine. Get help right away if:  You have very bad cramps in your back or belly (abdomen).  You have contractions.  You have chills.  You pass large clots or tissue from your vagina.  You bleed more.  You feel light-headed or weak.  You pass out (faint).  You are leaking fluid or have a gush of fluid from your vagina. This information is not intended to replace advice given to you by your health care provider. Make sure you discuss any questions you have with your health care provider. Document  Released: 02/11/2014 Document Revised: 03/04/2016 Document Reviewed: 06/04/2013 Elsevier Interactive Patient Education  2018 Elsevier Inc.  

## 2018-07-04 NOTE — MAU Note (Signed)
PT called RN to bathroom just before she was being discharged to see some scant, bright red blood on toilet paper. Pt was concerned about this so provider Judie Petit(M. Denyse AmassBhambri) was notified. Provider said that bleeding could be d/t recent pelvic and SVE and to doppler patient for reassurance. Also told writer to reinforce precautions for bleeding in 2nd trimester of pregnancy. Writer did doppler and FHR was 155-157. Spoke w/patient further about bleeding because she stated she was extremely scared about what it means. Told patient to wear a regular menstrual pad for next few days to be able to quantify bleeding. Told her to return to MAU if she has any signs/symptoms regarding bleeding that we talked about at discharge and that provider discussed with her. Patient seemed slightly reassured by this but still expressed worry that she "had done something" to cause bleeding.  Pt left with instructions which she said she did understand.

## 2018-07-04 NOTE — Progress Notes (Signed)
Subjective:  Barbara MoralesMelony Cox is a 27 y.o. G3P1011 at 3475w6d being seen today for ongoing prenatal care.  She is currently monitored for the following issues for this low-risk pregnancy and has Supervision of other normal pregnancy, antepartum on their problem list.  Patient reports no complaints.  Contractions: Not present. Vag. Bleeding: None.  Movement: Present. Denies leaking of fluid.   The following portions of the patient's history were reviewed and updated as appropriate: allergies, current medications, past family history, past medical history, past social history, past surgical history and problem list. Problem list updated.  Objective:   Vitals:   07/04/18 1109  BP: 116/62  Pulse: 76  Weight: 160 lb 3.2 oz (72.7 kg)    Fetal Status: Fetal Heart Rate (bpm): 150   Movement: Present     General:  Alert, oriented and cooperative. Patient is in no acute distress.  Skin: Skin is warm and dry. No rash noted.   Cardiovascular: Normal heart rate noted  Respiratory: Normal respiratory effort, no problems with respiration noted  Abdomen: Soft, gravid, appropriate for gestational age. Pain/Pressure: Present     Pelvic:  Cervical exam deferred        Extremities: Normal range of motion.  Edema: None  Mental Status: Normal mood and affect. Normal behavior. Normal judgment and thought content.   Urinalysis:      Assessment and Plan:  Pregnancy: G3P1011 at 5875w6d  1. Supervision of other normal pregnancy, antepartum Rx: - US MFM OB COMP + 14 WK; Future - Prenat-Fe Poly-Methfol-FA-DHA (VITAFOL ULTRA) 29-0.6-0.4-200 MG CAPS; Take 1 tablet by mouth daily before breakfast.  Dispense: 90 capsule; Refill: 4  2. Chronic midline low back pain without sciatica Rx: - Elastic Bandages & Supports (COMFORT FIT MATERNITY SUPP SM) MISC; Wear as directed.  Dispense: 1 each; Refill: 0  Preterm labor symptoms and general obstetric precautions including but not limited to vaginal bleeding, contractions,  leaking of fluid and fetal movement were reviewed in detail with the patient. Please refer to After Visit Summary for other counseling recommendations.  Return in about 4 weeks (around 08/01/2018) for ROB.   Brock BadHarper, Charles A, MD
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
## 2018-07-04 NOTE — Progress Notes (Signed)
ROB w/ complaints of right sided pain and fingers being numb sometimes.

## 2018-07-05 LAB — RH IG WORKUP (INCLUDES ABO/RH)
ABO/RH(D): O NEG
ANTIBODY SCREEN: NEGATIVE
Gestational Age(Wks): 15
Unit division: 0

## 2018-07-18 ENCOUNTER — Encounter: Payer: Medicaid Other | Admitting: Obstetrics

## 2018-07-19 ENCOUNTER — Encounter (HOSPITAL_COMMUNITY): Payer: Self-pay

## 2018-07-26 ENCOUNTER — Ambulatory Visit (HOSPITAL_COMMUNITY)
Admission: RE | Admit: 2018-07-26 | Discharge: 2018-07-26 | Disposition: A | Discharge status: Home / Self Care | Payer: Medicaid Other | Source: Ambulatory Visit | Attending: Obstetrics | Admitting: Obstetrics

## 2018-07-26 DIAGNOSIS — Z363 Encounter for antenatal screening for malformations: Secondary | ICD-10-CM

## 2018-07-26 DIAGNOSIS — Z3A19 19 weeks gestation of pregnancy: Secondary | ICD-10-CM | POA: Insufficient documentation

## 2018-07-26 DIAGNOSIS — Z3482 Encounter for supervision of other normal pregnancy, second trimester: Secondary | ICD-10-CM | POA: Insufficient documentation

## 2018-07-26 DIAGNOSIS — Z3480 Encounter for supervision of other normal pregnancy, unspecified trimester: Secondary | ICD-10-CM | POA: Diagnosis present

## 2018-07-28 ENCOUNTER — Encounter: Payer: Medicaid Other | Admitting: Obstetrics

## 2018-08-09 ENCOUNTER — Encounter: Payer: Medicaid Other | Admitting: Obstetrics

## 2018-08-10 ENCOUNTER — Encounter: Payer: Medicaid Other | Admitting: Obstetrics

## 2018-10-06 ENCOUNTER — Ambulatory Visit (INDEPENDENT_AMBULATORY_CARE_PROVIDER_SITE_OTHER): Payer: Medicaid Other | Admitting: Obstetrics

## 2018-10-06 VITALS — BP 108/68 | HR 97 | Wt 182.4 lb

## 2018-10-06 DIAGNOSIS — O360930 Maternal care for other rhesus isoimmunization, third trimester, not applicable or unspecified: Secondary | ICD-10-CM

## 2018-10-06 DIAGNOSIS — M549 Dorsalgia, unspecified: Secondary | ICD-10-CM

## 2018-10-06 DIAGNOSIS — Z3483 Encounter for supervision of other normal pregnancy, third trimester: Secondary | ICD-10-CM

## 2018-10-06 DIAGNOSIS — O26899 Other specified pregnancy related conditions, unspecified trimester: Secondary | ICD-10-CM

## 2018-10-06 DIAGNOSIS — O26893 Other specified pregnancy related conditions, third trimester: Secondary | ICD-10-CM

## 2018-10-06 DIAGNOSIS — Z23 Encounter for immunization: Secondary | ICD-10-CM | POA: Diagnosis not present

## 2018-10-06 DIAGNOSIS — Z6791 Unspecified blood type, Rh negative: Secondary | ICD-10-CM

## 2018-10-06 DIAGNOSIS — Z3480 Encounter for supervision of other normal pregnancy, unspecified trimester: Secondary | ICD-10-CM

## 2018-10-06 MED ORDER — RHO D IMMUNE GLOBULIN 1500 UNIT/2ML IJ SOSY
300.0000 ug | PREFILLED_SYRINGE | Freq: Once | INTRAMUSCULAR | Status: AC
  Administered 2018-10-06: 300 ug via INTRAMUSCULAR

## 2018-10-06 MED ORDER — COMFORT FIT MATERNITY SUPP SM MISC: 0 refills | Status: DC

## 2018-10-06 NOTE — Patient Instructions (Signed)
Tdap Vaccine (Tetanus, Diphtheria and Pertussis): What You Need to Know 1. Why get vaccinated? Tetanus, diphtheria and pertussis are very serious diseases. Tdap vaccine can protect [place] from these diseases. And, Tdap vaccine given to pregnant women can protect newborn babies against pertussis.Marland Kitchen TETANUS (Lockjaw) is rare in the [place] today. It causes painful muscle tightening and stiffness, usually all over the body.  It can lead to tightening of muscles in the head and neck so you can't open your mouth, swallow, or sometimes even breathe. Tetanus kills about 1 out of 10 people who are infected even after receiving the best medical care. DIPHTHERIA is also rare in the [place] today. It can cause a thick coating to form in the back of the throat.  It can lead to breathing problems, heart failure, paralysis, and death. PERTUSSIS (Whooping Cough) causes severe coughing spells, which can cause difficulty breathing, vomiting and disturbed sleep.  It can also lead to weight loss, incontinence, and rib fractures. Up to 2 in 100 adolescents and 5 in 100 adults with pertussis are hospitalized or have complications, which could include pneumonia or death. These diseases are caused by bacteria. Diphtheria and pertussis are spread from person to person through secretions from coughing or sneezing. Tetanus enters the body through cuts, scratches, or wounds. Before vaccines, as many as 200,000 cases of diphtheria, 200,000 cases of pertussis, and hundreds of cases of tetanus, were reported in the [place] each year. Since vaccination began, reports of cases for tetanus and diphtheria have dropped by about 99% and for pertussis by about 80%. 2. Tdap vaccine Tdap vaccine can protect adolescents and adults from tetanus, diphtheria, and pertussis. One dose of Tdap is routinely given at age 61 or 77. People who did not get Tdap at that age should get it as soon as possible. Tdap is especially important  for healthcare professionals and anyone having close contact with a baby younger than 12 months. Pregnant women should get a dose of Tdap during every pregnancy, to protect the newborn from pertussis. Infants are most at risk for severe, life-threatening complications from pertussis. Another vaccine, called Td, protects against tetanus and diphtheria, but not pertussis. A Td booster should be given every 10 years. Tdap may be given as one of these boosters if you have never gotten Tdap before. Tdap may also be given after a severe cut or burn to prevent tetanus infection. Your doctor or the person giving you the vaccine can give you more information. Tdap may safely be given at the same time as other vaccines. 3. Some people should not get this vaccine  A person who has ever had a life-threatening allergic reaction after a previous dose of any diphtheria, tetanus or pertussis containing vaccine, OR has a severe allergy to any part of this vaccine, should not get Tdap vaccine. Tell the person giving the vaccine about any severe allergies.  Anyone who had coma or long repeated seizures within 7 days after a childhood dose of DTP or DTaP, or a previous dose of Tdap, should not get Tdap, unless a cause other than the vaccine was found. They can still get Td.  Talk to your doctor if you: ? have seizures or another nervous system problem, ? had severe pain or swelling after any vaccine containing diphtheria, tetanus or pertussis, ? ever had a condition called Guillain-Barr Syndrome (GBS), ? aren't feeling well on the day the shot is scheduled. 4. Risks With any medicine, including vaccines, there is  a chance of side effects. These are usually mild and go away on their own. Serious reactions are also possible but are rare. Most people who get Tdap vaccine do not have any problems with it. Mild problems following Tdap (Did not interfere with activities)  Pain where the shot was given (about 3 in 4  adolescents or 2 in 3 adults)  Redness or swelling where the shot was given (about 1 person in 5)  Mild fever of at least 100.40F (up to about 1 in 25 adolescents or 1 in 100 adults)  Headache (about 3 or 4 people in 10)  Tiredness (about 1 person in 3 or 4)  Nausea, vomiting, diarrhea, stomach ache (up to 1 in 4 adolescents or 1 in 10 adults)  Chills, sore joints (about 1 person in 10)  Body aches (about 1 person in 3 or 4)  Rash, swollen glands (uncommon) Moderate problems following Tdap (Interfered with activities, but did not require medical attention)  Pain where the shot was given (up to 1 in 5 or 6)  Redness or swelling where the shot was given (up to about 1 in 16 adolescents or 1 in 12 adults)  Fever over 102F (about 1 in 100 adolescents or 1 in 250 adults)  Headache (about 1 in 7 adolescents or 1 in 10 adults)  Nausea, vomiting, diarrhea, stomach ache (up to 1 or 3 people in 100)  Swelling of the entire arm where the shot was given (up to about 1 in 500). Severe problems following Tdap (Unable to perform usual activities; required medical attention)  Swelling, severe pain, bleeding and redness in the arm where the shot was given (rare). Problems that could happen after any vaccine:  People sometimes faint after a medical procedure, including vaccination. Sitting or lying down for about 15 minutes can help prevent fainting, and injuries caused by a fall. Tell your doctor if you feel dizzy, or have vision changes or ringing in the ears.  Some people get severe pain in the shoulder and have difficulty moving the arm where a shot was given. This happens very rarely.  Any medication can cause a severe allergic reaction. Such reactions from a vaccine are very rare, estimated at fewer than 1 in a million doses, and would happen within a few minutes to a few hours after the vaccination. As with any medicine, there is a very remote chance of a vaccine causing a serious  injury or death. The safety of vaccines is always being monitored. For more information, visit: http://www.aguilar.org/ 5. What if there is a serious problem? What should I look for?  Look for anything that concerns you, such as signs of a severe allergic reaction, very high fever, or unusual behavior. Signs of a severe allergic reaction can include hives, swelling of the face and throat, difficulty breathing, a fast heartbeat, dizziness, and weakness. These would usually start a few minutes to a few hours after the vaccination. What should I do?  If you think it is a severe allergic reaction or other emergency that can't wait, call 9-1-1 or get the person to the nearest hospital. Otherwise, call your doctor.  Afterward, the reaction should be reported to the Vaccine Adverse Event Reporting System (VAERS). Your doctor might file this report, or you can do it yourself through the VAERS web site at www.vaers.SamedayNews.es, or by calling 302-685-4097. VAERS does not give medical advice. 6. The National Vaccine Injury Compensation Program The [employer] Vaccine Injury Compensation Program ([place]) is  call your doctor.  · Afterward, the reaction should be reported to the Vaccine Adverse Event Reporting System (VAERS). Your doctor might file this report, or you can do it yourself through the VAERS web site at www.vaers.hhs.gov, or by calling 1-800-822-7967.  VAERS does not give medical advice.  6. The National Vaccine Injury Compensation Program  The National Vaccine Injury Compensation Program (VICP) is a federal program that was created to compensate people who may have been injured by certain vaccines.  Persons who believe they may have been injured by a vaccine can learn about the program and about filing a claim by calling 1-800-338-2382 or visiting the VICP website at www.hrsa.gov/vaccinecompensation. There is a time limit to file a claim for compensation.  7. How can I learn more?  · Ask your doctor. He or she can give you the vaccine package insert or suggest other sources of information.  · Call your local or state health department.  · Contact the Centers for Disease Control and Prevention (CDC):  ? Call 1-800-232-4636 (1-800-CDC-INFO) or  ? Visit CDC's website at www.cdc.gov/vaccines  Vaccine Information Statement Tdap Vaccine (12/04/2013)  This information is not intended to replace advice given to you  by your health care provider. Make sure you discuss any questions you have with your health care provider.  Document Released: 03/28/2012 Document Revised: 05/15/2018 Document Reviewed: 05/15/2018  Elsevier Interactive Patient Education © 2019 Elsevier Inc.  Influenza (Flu) Vaccine (Inactivated or Recombinant): What You Need to Know  1. Why get vaccinated?  Influenza vaccine can prevent influenza (flu).  Flu is a contagious disease that spreads around the [place] every year, usually between October and May. Anyone can get the flu, but it is more dangerous for some people. Infants and young children, people 65 years of age and older, pregnant women, and people with certain health conditions or a weakened immune system are at greatest risk of flu complications.  Pneumonia, bronchitis, sinus infections and ear infections are examples of flu-related complications. If you have a medical condition, such as heart disease, cancer or diabetes, flu can make it worse.  Flu can cause fever and chills, sore throat, muscle aches, fatigue, cough, headache, and runny or stuffy nose. Some people may have vomiting and diarrhea, though this is more common in children than adults.  Each year thousands of people in the [place] die from flu, and many more are hospitalized. Flu vaccine prevents millions of illnesses and flu-related visits to the doctor each year.  2. Influenza vaccine  CDC recommends everyone 6 months of age and older get vaccinated every flu season. Children 6 months through 8 years of age may need 2 doses during a single flu season. Everyone else needs only 1 dose each flu season.  It takes about 2 weeks for protection to develop after vaccination.  There are many flu viruses, and they are always changing. Each year a new flu vaccine is made to protect against three or four viruses that are likely to cause disease in the upcoming flu season. Even when the vaccine doesn't exactly match these viruses, it may  still provide some protection.  Influenza vaccine does not cause flu.  Influenza vaccine may be given at the same time as other vaccines.  3. Talk with your health care provider  Tell your vaccine provider if the person getting the vaccine:  · Has had an allergic reaction after a previous dose of influenza vaccine, or has any severe, life-threatening allergies.  ·   Has ever had Guillain-Barré Syndrome (also called GBS).  In some cases, your health care provider may decide to postpone influenza vaccination to a future visit.  People with minor illnesses, such as a cold, may be vaccinated. People who are moderately or severely ill should usually wait until they recover before getting influenza vaccine.  Your health care provider can give you more information.  4. Risks of a vaccine reaction  · Soreness, redness, and swelling where shot is given, fever, muscle aches, and headache can happen after influenza vaccine.  · There may be a very small increased risk of Guillain-Barré Syndrome (GBS) after inactivated influenza vaccine (the flu shot).  Young children who get the flu shot along with pneumococcal vaccine (PCV13), and/or DTaP vaccine at the same time might be slightly more likely to have a seizure caused by fever. Tell your health care provider if a child who is getting flu vaccine has ever had a seizure.  People sometimes faint after medical procedures, including vaccination. Tell your provider if you feel dizzy or have vision changes or ringing in the ears.  As with any medicine, there is a very remote chance of a vaccine causing a severe allergic reaction, other serious injury, or death.  5. What if there is a serious problem?  An allergic reaction could occur after the vaccinated person leaves the clinic. If you see signs of a severe allergic reaction (hives, swelling of the face and throat, difficulty breathing, a fast heartbeat, dizziness, or weakness), call 9-1-1 and get the person to the nearest  hospital.  For other signs that concern you, call your health care provider.  Adverse reactions should be reported to the Vaccine Adverse Event Reporting System (VAERS). Your health care provider will usually file this report, or you can do it yourself. Visit the VAERS website at www.vaers.hhs.gov or call 1-800-822-7967.VAERS is only for reporting reactions, and VAERS staff do not give medical advice.  6. The National Vaccine Injury Compensation Program  The National Vaccine Injury Compensation Program (VICP) is a federal program that was created to compensate people who may have been injured by certain vaccines. Visit the VICP website at www.hrsa.gov/vaccinecompensation or call 1-800-338-2382 to learn about the program and about filing a claim. There is a time limit to file a claim for compensation.  7. How can I learn more?  · Ask your healthcare provider.  · Call your local or state health department.  · Contact the Centers for Disease Control and Prevention (CDC):  ? Call 1-800-232-4636 (1-800-CDC-INFO) or  ? Visit CDC's www.cdc.gov/flu  Vaccine Information Statement (Interim) Inactivated Influenza Vaccine (05/25/2018)  This information is not intended to replace advice given to you by your health care provider. Make sure you discuss any questions you have with your health care provider.  Document Released: 07/22/2006 Document Revised: 05/29/2018 Document Reviewed: 05/29/2018  Elsevier Interactive Patient Education © 2019 Elsevier Inc.

## 2018-10-08 ENCOUNTER — Encounter: Payer: Self-pay | Admitting: Obstetrics

## 2018-10-08 NOTE — Progress Notes (Signed)
Subjective:  Barbara MoralesMelony Cox is a 27 y.o. G3P1011 at 1636w4d being seen today for ongoing prenatal care.  She is currently monitored for the following issues for this high-risk pregnancy and has Supervision of other normal pregnancy, antepartum and Rh negative status during pregnancy on their problem list.  Patient reports no complaints.  Contractions: Irritability. Vag. Bleeding: None.  Movement: Present. Denies leaking of fluid.   The following portions of the patient's history were reviewed and updated as appropriate: allergies, current medications, past family history, past medical history, past social history, past surgical history and problem list. Problem list updated.  Objective:   Vitals:   10/06/18 0950  BP: 108/68  Pulse: 97  Weight: 182 lb 6.4 oz (82.7 kg)    Fetal Status:     Movement: Present     General:  Alert, oriented and cooperative. Patient is in no acute distress.  Skin: Skin is warm and dry. No rash noted.   Cardiovascular: Normal heart rate noted  Respiratory: Normal respiratory effort, no problems with respiration noted  Abdomen: Soft, gravid, appropriate for gestational age. Pain/Pressure: Present     Pelvic:  Cervical exam deferred        Extremities: Normal range of motion.  Edema: Trace  Mental Status: Normal mood and affect. Normal behavior. Normal judgment and thought content.   Urinalysis:      Assessment and Plan:  Pregnancy: G3P1011 at 236w4d  1. Supervision of other normal pregnancy, antepartum Rx: - Tdap vaccine greater than or equal to 7yo IM - Glucose Tolerance, 2 Hours w/1 Hour; Future - RPR; Future - CBC; Future - HIV antibody (with reflex); Future  2. Rh negative state in antepartum period Rx: - rho (d) immune globulin (RHIG/RHOPHYLAC) injection 300 mcg  3. Backache symptom Rx: - Elastic Bandages & Supports (COMFORT FIT MATERNITY SUPP SM) MISC; Wear as directed.  Dispense: 1 each; Refill: 0  4. Need for immunization against  influenza Rx: - Flu Vaccine QUAD 36+ mos IM  Preterm labor symptoms and general obstetric precautions including but not limited to vaginal bleeding, contractions, leaking of fluid and fetal movement were reviewed in detail with the patient. Please refer to After Visit Summary for other counseling recommendations.  Return in about 2 weeks (around 10/20/2018) for ROB.   Brock BadHarper, Iva Montelongo A, MD

## 2018-10-10 ENCOUNTER — Other Ambulatory Visit: Payer: Medicaid Other

## 2018-10-11 NOTE — L&D Delivery Note (Addendum)
Delivery Note Barbara Cox is a 28 y.o. G3P1011 at [redacted]w[redacted]d admitted for elective IOL for thrombocytopenia.  ROM: 5h 64m with clear fluid  At 1452 a viable girl was delivered via spontaneous vaginal delivery (Presentation: cephalic; LOA ). After delivery of head, complication of shoulder dystocia. Delivery of infant using McRoberts and delivery of posterior arm by CNM. Infant placed directly on mom's abdomen for bonding/skin-to-skin. Delayed cord clamping x , then cord clamped x 2, and cut by friend/family member.  APGAR: pending; weight: pending at time of note.  40 units of pitocin diluted in 1000cc LR was infused rapidly IV per protocol. The placenta separated spontaneously and delivered via CCT and maternal pushing effort.  It was inspected and appears to be intact with a 3 VC.  Placenta/Cord with no complications.  Cord pH: pending  Intrapartum complications:  Shoulder Dystocia Anesthesia:  epidural Episiotomy: none Lacerations:  none Suture Repair: n/a Est. Blood Loss (mL): 534 Sponge and instrument count were correct x2.  Mom to postpartum.  Baby to Couplet care / Skin to Skin. Placenta to L&D. Plans to breast/bottle feed Contraception: depo Circ: n/a  Brand Males SNM 12/13/2018 3:20 PM   I was gloved and present for delivery in its entirety. Complication of delivery included shoulder dystocia. McRoberts and delivery of posterior arm by me to deliver fetus. Placed directly skin to skin after delivery and passed to NICU for assessment after delayed cord clamping. Cord gas collected and results pending.    See NICU consultation note for details. NICU cleared fetus and allowed to be couplet care.   Sharyon Cable, CNM 12/13/18, 3:41 PM

## 2018-10-13 ENCOUNTER — Other Ambulatory Visit: Payer: Medicaid Other

## 2018-10-13 ENCOUNTER — Encounter: Payer: Self-pay | Admitting: Obstetrics

## 2018-10-13 ENCOUNTER — Other Ambulatory Visit (HOSPITAL_COMMUNITY)
Admission: RE | Admit: 2018-10-13 | Discharge: 2018-10-13 | Disposition: A | Discharge status: Home / Self Care | Payer: Medicaid Other | Source: Ambulatory Visit | Attending: Obstetrics | Admitting: Obstetrics

## 2018-10-13 DIAGNOSIS — Z3480 Encounter for supervision of other normal pregnancy, unspecified trimester: Secondary | ICD-10-CM | POA: Diagnosis not present

## 2018-10-13 DIAGNOSIS — N898 Other specified noninflammatory disorders of vagina: Secondary | ICD-10-CM | POA: Diagnosis not present

## 2018-10-13 NOTE — Addendum Note (Signed)
Addended by: Burnell Blanks on: 10/13/2018 09:20 AM   Modules accepted: Orders

## 2018-10-14 LAB — CBC
HEMOGLOBIN: 8.3 g/dL — AB (ref 11.1–15.9)
Hematocrit: 26.5 % — ABNORMAL LOW (ref 34.0–46.6)
MCH: 24.9 pg — AB (ref 26.6–33.0)
MCHC: 31.3 g/dL — ABNORMAL LOW (ref 31.5–35.7)
MCV: 79 fL (ref 79–97)
Platelets: 105 10*3/uL — ABNORMAL LOW (ref 150–450)
RBC: 3.34 x10E6/uL — AB (ref 3.77–5.28)
RDW: 14.1 % (ref 12.3–15.4)
WBC: 5 10*3/uL (ref 3.4–10.8)

## 2018-10-14 LAB — HIV ANTIBODY (ROUTINE TESTING W REFLEX): HIV Screen 4th Generation wRfx: NONREACTIVE

## 2018-10-14 LAB — GLUCOSE TOLERANCE, 2 HOURS W/ 1HR
Glucose, 1 hour: 121 mg/dL (ref 65–179)
Glucose, 2 hour: 81 mg/dL (ref 65–152)
Glucose, Fasting: 87 mg/dL (ref 65–91)

## 2018-10-14 LAB — RPR: RPR: NONREACTIVE

## 2018-10-16 ENCOUNTER — Other Ambulatory Visit: Payer: Self-pay | Admitting: Obstetrics

## 2018-10-16 DIAGNOSIS — O99019 Anemia complicating pregnancy, unspecified trimester: Secondary | ICD-10-CM

## 2018-10-16 LAB — CERVICOVAGINAL ANCILLARY ONLY
BACTERIAL VAGINITIS: NEGATIVE
CANDIDA VAGINITIS: NEGATIVE

## 2018-10-16 MED ORDER — FERROUS SULFATE 325 (65 FE) MG PO TABS: 325.0000 mg | ORAL_TABLET | Freq: Three times a day (TID) | ORAL | 5 refills | Status: DC

## 2018-10-20 ENCOUNTER — Encounter: Payer: Medicaid Other | Admitting: Obstetrics

## 2018-10-20 ENCOUNTER — Inpatient Hospital Stay (HOSPITAL_COMMUNITY)
Admit: 2018-10-20 | Discharge: 2018-10-20 | Disposition: A | Discharge status: Home / Self Care | Payer: Medicaid Other | Attending: Family Medicine | Admitting: Family Medicine

## 2018-10-20 ENCOUNTER — Encounter (HOSPITAL_COMMUNITY): Payer: Self-pay | Admitting: *Deleted

## 2018-10-20 DIAGNOSIS — O26893 Other specified pregnancy related conditions, third trimester: Secondary | ICD-10-CM | POA: Diagnosis not present

## 2018-10-20 DIAGNOSIS — D696 Thrombocytopenia, unspecified: Secondary | ICD-10-CM | POA: Insufficient documentation

## 2018-10-20 DIAGNOSIS — Z87891 Personal history of nicotine dependence: Secondary | ICD-10-CM | POA: Insufficient documentation

## 2018-10-20 DIAGNOSIS — R102 Pelvic and perineal pain: Secondary | ICD-10-CM | POA: Diagnosis not present

## 2018-10-20 DIAGNOSIS — Z3A31 31 weeks gestation of pregnancy: Secondary | ICD-10-CM | POA: Insufficient documentation

## 2018-10-20 DIAGNOSIS — R109 Unspecified abdominal pain: Secondary | ICD-10-CM | POA: Insufficient documentation

## 2018-10-20 DIAGNOSIS — O99119 Other diseases of the blood and blood-forming organs and certain disorders involving the immune mechanism complicating pregnancy, unspecified trimester: Secondary | ICD-10-CM

## 2018-10-20 DIAGNOSIS — Z3689 Encounter for other specified antenatal screening: Secondary | ICD-10-CM

## 2018-10-20 HISTORY — DX: Anemia, unspecified: D64.9

## 2018-10-20 LAB — URINALYSIS, ROUTINE W REFLEX MICROSCOPIC
Bilirubin Urine: NEGATIVE
Glucose, UA: NEGATIVE mg/dL
HGB URINE DIPSTICK: NEGATIVE
Ketones, ur: 80 mg/dL — AB
Leukocytes, UA: NEGATIVE
Nitrite: NEGATIVE
PH: 5 (ref 5.0–8.0)
PROTEIN: NEGATIVE mg/dL
SPECIFIC GRAVITY, URINE: 1.014 (ref 1.005–1.030)

## 2018-10-20 LAB — CBC
HCT: 27.7 % — ABNORMAL LOW (ref 36.0–46.0)
Hemoglobin: 8.4 g/dL — ABNORMAL LOW (ref 12.0–15.0)
MCH: 24.9 pg — ABNORMAL LOW (ref 26.0–34.0)
MCHC: 30.3 g/dL (ref 30.0–36.0)
MCV: 82.2 fL (ref 80.0–100.0)
NRBC: 0 % (ref 0.0–0.2)
PLATELETS: 95 10*3/uL — AB (ref 150–400)
RBC: 3.37 MIL/uL — AB (ref 3.87–5.11)
RDW: 15.5 % (ref 11.5–15.5)
WBC: 5.1 10*3/uL (ref 4.0–10.5)

## 2018-10-20 LAB — WET PREP, GENITAL
CLUE CELLS WET PREP: NONE SEEN
SPERM: NONE SEEN
Trich, Wet Prep: NONE SEEN
Yeast Wet Prep HPF POC: NONE SEEN

## 2018-10-20 MED ORDER — ACETAMINOPHEN 500 MG PO TABS
1000.0000 mg | ORAL_TABLET | Freq: Four times a day (QID) | ORAL | Status: DC | PRN
  Administered 2018-10-20: 1000 mg via ORAL
  Filled 2018-10-20: qty 2

## 2018-10-20 MED ORDER — COMFORT FIT MATERNITY SUPP MED MISC: 1.0000 | Freq: Every day | 0 refills | Status: DC

## 2018-10-20 NOTE — MAU Provider Note (Addendum)
History     CSN: [id number]  Arrival date and time: 10/20/18 1504   First Provider Initiated Contact with Patient 10/20/18 1603      Chief Complaint  Patient presents with  . Pelvic Pain  . Leg Swelling   G3P1011 @31 .2 weeks presenting with LAP/pelvic pain. Pain worsened as she took a walk this afternoon. Describes as vaginal pressure and low pelvic pain. Pain is intermittent and rates 10/10. Has not taken anything for it. Pain improved slightly since lying down. Had emesis once this am, has not eaten today. C/o constiptaion with small BM last night. Reports bilateral lower extremity edema that improves with elevation. No redness. Having some shooting pain in her right leg. Denies VB, LOF, and ctx. Reports good FM.    OB History    Gravida  3   Para  1   Term  1   Preterm      AB  1   Living  1     SAB      TAB  1   Ectopic      Multiple      Live Births  1           Past Medical History:  Diagnosis Date  . Anemia     Past Surgical History:  Procedure Laterality Date  . NO PAST SURGERIES      Family History  Problem Relation Age of Onset  . Leukemia Father   . Breast cancer Sister     Social History   Tobacco Use  . Smoking status: Former Smoker    Last attempt to quit: 07/20/2018    Years since quitting: 0.2  . Smokeless tobacco: Never Used  . Tobacco comment: occasional (5 in the past week)  Substance Use Topics  . Alcohol use: No  . Drug use: Never    Allergies:  Allergies  Allergen Reactions  . Peanuts [Peanut Oil]     Anaphylaxis     No medications prior to admission.    Review of Systems  Constitutional: Negative for chills and fever.  Gastrointestinal: Positive for abdominal pain, constipation, nausea and vomiting. Negative for diarrhea.  Genitourinary: Positive for pelvic pain. Negative for dysuria, frequency, hematuria, vaginal bleeding and vaginal discharge.  Neurological: Positive for headaches.   Physical Exam    Blood pressure (!) 115/59, pulse 95, temperature 98 F (36.7 C), temperature source Oral, resp. rate 18, height 5\' 2"  (1.575 m), weight 83.5 kg, last menstrual period 03/15/2018.  Physical Exam  Constitutional: She is oriented to person, place, and time. She appears well-developed and well-nourished. No distress.  HENT:  Head: Normocephalic and atraumatic.  Neck: Normal range of motion.  Cardiovascular: Normal rate.  Respiratory: Effort normal. No respiratory distress.  GI: Soft. She exhibits no distension. There is abdominal tenderness in the suprapubic area.  gravid  Genitourinary:    Genitourinary Comments: SVE closed/thick   Musculoskeletal: Normal range of motion.        General: No edema.  Neurological: She is alert and oriented to person, place, and time.  Skin: Skin is warm and dry.  Psychiatric: She has a normal mood and affect.  EFM: 135 bpm, mod variability, + accels, no decels Toco: irritability  Results for orders placed or performed during the hospital encounter of 10/20/18 (from the past 24 hour(s))  Urinalysis, Routine w reflex microscopic     Status: Abnormal   Collection Time: 10/20/18  3:37 PM  Result Value Ref Range  Color, Urine YELLOW YELLOW   APPearance CLEAR CLEAR   Specific Gravity, Urine 1.014 1.005 - 1.030   pH 5.0 5.0 - 8.0   Glucose, UA NEGATIVE NEGATIVE mg/dL   Hgb urine dipstick NEGATIVE NEGATIVE   Bilirubin Urine NEGATIVE NEGATIVE   Ketones, ur 80 (A) NEGATIVE mg/dL   Protein, ur NEGATIVE NEGATIVE mg/dL   Nitrite NEGATIVE NEGATIVE   Leukocytes, UA NEGATIVE NEGATIVE  Wet prep, genital     Status: Abnormal   Collection Time: 10/20/18  4:27 PM  Result Value Ref Range   Yeast Wet Prep HPF POC NONE SEEN NONE SEEN   Trich, Wet Prep NONE SEEN NONE SEEN   Clue Cells Wet Prep HPF POC NONE SEEN NONE SEEN   WBC, Wet Prep HPF POC FEW (A) NONE SEEN   Sperm NONE SEEN   CBC     Status: Abnormal   Collection Time: 10/20/18  5:04 PM  Result Value Ref  Range   WBC 5.1 4.0 - 10.5 K/uL   RBC 3.37 (L) 3.87 - 5.11 MIL/uL   Hemoglobin 8.4 (L) 12.0 - 15.0 g/dL   HCT 16.127.7 (L) 09.636.0 - 04.546.0 %   MCV 82.2 80.0 - 100.0 fL   MCH 24.9 (L) 26.0 - 34.0 pg   MCHC 30.3 30.0 - 36.0 g/dL   RDW 40.915.5 81.111.5 - 91.415.5 %   Platelets 95 (L) 150 - 400 K/uL   nRBC 0.0 0.0 - 0.2 %   MAU Course  Procedures Tylenol  MDM Orders Placed This Encounter  Procedures  . Wet prep, genital    Standing Status:   Standing    Number of Occurrences:   1    Order Specific Question:   Patient immune status    Answer:   Normal  . Urinalysis, Routine w reflex microscopic    Standing Status:   Standing    Number of Occurrences:   1  . CBC    Standing Status:   Standing    Number of Occurrences:   1  . Discharge patient    Order Specific Question:   Discharge disposition    Answer:   01-Home or Self Care [1]    Order Specific Question:   Discharge patient date    Answer:   10/20/2018   Labs ordered and reviewed. Pain improved. No evidence of PTL, UTI, or pelvic infection. Pain likely physiologic to advancing pregnancy. Discussed comfort measures. No evidence of DVT. Stable for discharge.  Assessment and Plan   1. [redacted] weeks gestation of pregnancy   2. NST (non-stress test) reactive   3. Pelvic pain affecting pregnancy in third trimester, antepartum   4. Thrombocytopenia affecting pregnancy [facility](HCC)    Discharge home Follow up at [facility] next week as scheduled PTL precautions Rx Maternity belt  Allergies as of 10/20/2018      Reactions   Peanuts [peanut Oil]    Anaphylaxis       Medication List    TAKE these medications   COMFORT FIT MATERNITY SUPP MED Misc 1 Device by Does not apply route daily. What changed:    how much to take  how to take this  when to take this  additional instructions  Another medication with the same name was removed. Continue taking this medication, and follow the directions you see here.   Doxylamine-Pyridoxine 10-10 MG  Tbec Commonly known as:  DICLEGIS 1 tab in AM, 1 tab mid afternoon 2 tabs at bedtime. Max dose 4 tabs daily.  ferrous sulfate 325 (65 FE) MG tablet Take 1 tablet (325 mg total) by mouth 3 (three) times daily with meals.   VITAFOL ULTRA 29-0.6-0.4-200 MG Caps Take 1 tablet by mouth daily before breakfast.      Donette Larry, CNM 10/20/2018, 6:54 PM

## 2018-10-20 NOTE — Discharge Instructions (Signed)
Abdominal Pain During Pregnancy  Abdominal pain is common during pregnancy, and has many possible causes. Some causes are more serious than others, and sometimes the cause is not known. Abdominal pain can be a sign that labor is starting. It can also be caused by normal growth and stretching of muscles and ligaments during pregnancy. Always tell your health care provider if you have any abdominal pain. Follow these instructions at home:  Do not have sex or put anything in your vagina until your pain goes away completely.  Get plenty of rest until your pain improves.  Drink enough fluid to keep your urine pale yellow.  Take over-the-counter and prescription medicines only as told by your health care provider.  Keep all follow-up visits as told by your health care provider. This is important. Contact a health care provider if:  Your pain continues or gets worse after resting.  You have lower abdominal pain that: ? Comes and goes at regular intervals. ? Spreads to your back. ? Is similar to menstrual cramps.  You have pain or burning when you urinate. Get help right away if:  You have a fever or chills.  You have vaginal bleeding.  You are leaking fluid from your vagina.  You are passing tissue from your vagina.  You have vomiting or diarrhea that lasts for more than 24 hours.  Your baby is moving less than usual.  You feel very weak or faint.  You have shortness of breath.  You develop severe pain in your upper abdomen. Summary  Abdominal pain is common during pregnancy, and has many possible causes.  If you experience abdominal pain during pregnancy, tell your health care provider right away.  Follow your health care provider's home care instructions and keep all follow-up visits as directed. This information is not intended to replace advice given to you by your health care provider. Make sure you discuss any questions you have with your health care  provider. Document Released: 09/27/2005 Document Revised: 12/30/2016 Document Reviewed: 12/30/2016 Elsevier Interactive Patient Education  2019 [employer]    PREGNANCY SUPPORT BELT: You are not alone, Seventy-five percent of women have some sort of abdominal or back pain at some point in their pregnancy. Your baby is growing at a fast pace, which means that your whole body is rapidly trying to adjust to the changes. As your uterus grows, your back may start feeling a bit under stress and this can result in back or abdominal pain that can go from mild, and therefore bearable, to severe pains that will not allow you to sit or lay down comfortably, When it comes to dealing with pregnancy-related pains and cramps, some pregnant women usually prefer natural remedies, which the market is filled with nowadays. For example, wearing a pregnancy support belt can help ease and lessen your discomfort and pain. WHAT ARE THE BENEFITS OF WEARING A PREGNANCY SUPPORT BELT? A pregnancy support belt provides support to the lower portion of the belly taking some of the weight of the growing uterus and distributing to the other parts of your body. It is designed make you comfortable and gives you extra support. Over the years, the pregnancy apparel market has been studying the needs and wants of pregnant women and they have come up with the most comfortable pregnancy support belts that woman could ever ask for. In fact, you will no longer have to wear a stretched-out or bulky pregnancy belt that is visible underneath your clothes and makes you feel  even more uncomfortable. Nowadays, a pregnancy support belt is made of comfortable and stretchy materials that will not irritate your skin but will actually make you feel at ease and you will not even notice you are wearing it. They are easy to put on and adjust during the day and can be worn at night for additional support.  BENEFITS:  Relives Back pain  Relieves Abdominal  Muscle and Leg Pain  Stabilizes the Pelvic Ring  Offers a Cushioned Abdominal Lift Pad  Relieves pressure on the Sciatic Nerve Within Minutes WHERE TO GET YOUR PREGNANCY BELT: [facility] [phone] @[phone]  [address] [place], [place] [id number]

## 2018-10-20 NOTE — MAU Note (Signed)
Pt C/O pelvic pressure, she took a short walk about two hours ago, pain began during this time, unable to get back to her destination, had to call someone.  Legs are also swollen, having heartburn.  C/O slight contractions, denies bleeding or LOF.  Reports good fetal movement.

## 2018-10-20 NOTE — MAU Provider Note (Signed)
History   Chief Complaint  Patient presents with  . Pelvic Pain  . Leg Swelling   Barbara Cox is a G3P1011 27 YOF at [redacted]w[redacted]d who presents with abdominal pain. She noticed some lower abdominal pain this morning that worsened while taking a walk. She describes the pain as vaginal pressure/lower pelvic pain. She had to call a friend to pick her up from her walk. Her pain has been intermittent, sometimes lasting 5 minutes and returning 10-15 minutes later. She has not eaten today due to the pain and vomited once. Reports intermittent constipation with small BM last night, no diarrhea. She has noticed mild LE edema that is barely enough to make a sock imprint. It improves with elevation and is not associated with any leg tenderness. She reports good fetal movement. Denies vaginal bleeding, regular contractions, fluid leakage, CP, or SOB. She also reports some right leg pain that shoot from her upper leg to her foot. She has hx of sciatica. She developed a minor tension HA after presenting for care.      OB History    Gravida  3   Para  1   Term  1   Preterm      AB  1   Living  1     SAB      TAB  1   Ectopic      Multiple      Live Births  1           Past Medical History:  Diagnosis Date  . Anemia     Past Surgical History:  Procedure Laterality Date  . NO PAST SURGERIES      Family History  Problem Relation Age of Onset  . Leukemia Father   . Breast cancer Sister     Social History   Tobacco Use  . Smoking status: Former Smoker    Last attempt to quit: 07/20/2018    Years since quitting: 0.2  . Smokeless tobacco: Never Used  . Tobacco comment: occasional (5 in the past week)  Substance Use Topics  . Alcohol use: No  . Drug use: Never    Allergies:  Allergies  Allergen Reactions  . Peanuts [Peanut Oil]     Anaphylaxis     Medications Prior to Admission  Medication Sig Dispense Refill Last Dose  . Doxylamine-Pyridoxine (DICLEGIS) 10-10 MG TBEC 1  tab in AM, 1 tab mid afternoon 2 tabs at bedtime. Max dose 4 tabs daily. 100 tablet 5 Taking  . Elastic Bandages & Supports (COMFORT FIT MATERNITY SUPP SM) MISC Wear as directed. (Patient not taking: Reported on 10/06/2018) 1 each 0 Not Taking  . Elastic Bandages & Supports (COMFORT FIT MATERNITY SUPP SM) MISC Wear as directed. 1 each 0   . ferrous sulfate 325 (65 FE) MG tablet Take 1 tablet (325 mg total) by mouth 3 (three) times daily with meals. 90 tablet 5   . Prenat-Fe Poly-Methfol-FA-DHA (VITAFOL ULTRA) 29-0.6-0.4-200 MG CAPS Take 1 tablet by mouth daily before breakfast. 90 capsule 4 Taking    Review of Systems  Constitutional: Negative for chills and fever.  Eyes: Negative.   Respiratory: Negative for cough, hemoptysis and shortness of breath.   Cardiovascular: Positive for leg swelling.  Gastrointestinal: Positive for abdominal pain, constipation, heartburn and vomiting. Negative for blood in stool and diarrhea.  Genitourinary: Negative.   Skin: Negative.   Neurological: Negative for dizziness and focal weakness.   Physical Exam Blood pressure (!) 115/59, pulse 95,  temperature 98 F (36.7 C), temperature source Oral, resp. rate 18, height 5\' 2"  (1.575 m), weight 83.5 kg, last menstrual period 03/15/2018. Physical Exam  Constitutional: She is oriented to person, place, and time. She appears well-nourished.  Uncomfortable appearing  Cardiovascular: Normal rate and regular rhythm.  Sock imprints present bilaterally from trace non-pitting edema, no erythema or tenderness  Respiratory: Effort normal and breath sounds normal.  GI:  Suprapubic tenderness with moderate palpation, mild LLQ pain with palpation  Neurological: She is oriented to person, place, and time.  Skin: Skin is warm and dry.  Psychiatric: She has a normal mood and affect.  Female genitalia: not done Cervix: cervical motion tenderness absent and cervix thick and closed Uterus: Fetal head palpable in suprapubic  region Fetal monitoring: Moderate variability, no decels  MAU Course Procedures  MDM - UA unremarkable aside from 80 ketones - Wet prep neg - Tylenol given - CBC -GC/Chlamydia  Assessment and Plan  - Presentation consistent with pregnancy-related pelvic pain, likely related to baby's position and movement. Discussed use of pregnancy belt and Tylenol.  - Preterm labor unlikely given closed and firm cervix, no regular contractions. Also reassured by lack of vaginal bleeding, normal fetal movement, normal FHT. Appendicitis/Cholecystitis unlikely given the intermittent nature of the pain and lack of fever.  - Encouraged oral hydration due to ketones in urine.

## 2018-10-23 LAB — GC/CHLAMYDIA PROBE AMP (~~LOC~~) NOT AT ARMC
CHLAMYDIA, DNA PROBE: NEGATIVE
NEISSERIA GONORRHEA: NEGATIVE

## 2018-10-24 ENCOUNTER — Encounter: Payer: Self-pay | Admitting: Obstetrics

## 2018-10-24 ENCOUNTER — Ambulatory Visit (INDEPENDENT_AMBULATORY_CARE_PROVIDER_SITE_OTHER): Payer: Medicaid Other | Admitting: Obstetrics

## 2018-10-24 VITALS — BP 115/79 | HR 74 | Wt 182.4 lb

## 2018-10-24 DIAGNOSIS — O26899 Other specified pregnancy related conditions, unspecified trimester: Secondary | ICD-10-CM

## 2018-10-24 DIAGNOSIS — O26892 Other specified pregnancy related conditions, second trimester: Secondary | ICD-10-CM

## 2018-10-24 DIAGNOSIS — O99113 Other diseases of the blood and blood-forming organs and certain disorders involving the immune mechanism complicating pregnancy, third trimester: Secondary | ICD-10-CM

## 2018-10-24 DIAGNOSIS — D696 Thrombocytopenia, unspecified: Secondary | ICD-10-CM

## 2018-10-24 DIAGNOSIS — O99119 Other diseases of the blood and blood-forming organs and certain disorders involving the immune mechanism complicating pregnancy, unspecified trimester: Secondary | ICD-10-CM

## 2018-10-24 DIAGNOSIS — Z3483 Encounter for supervision of other normal pregnancy, third trimester: Secondary | ICD-10-CM

## 2018-10-24 DIAGNOSIS — Z3480 Encounter for supervision of other normal pregnancy, unspecified trimester: Secondary | ICD-10-CM

## 2018-10-24 DIAGNOSIS — Z6791 Unspecified blood type, Rh negative: Secondary | ICD-10-CM

## 2018-10-24 NOTE — Progress Notes (Signed)
Subjective:  Barbara Cox is a 28 y.o. G3P1011 at [redacted]w[redacted]d being seen today for ongoing prenatal care.  She is currently monitored for the following issues for this high-risk pregnancy and has Supervision of other normal pregnancy, antepartum; Rh negative status during pregnancy; and Thrombocytopenia affecting pregnancy (HCC) on their problem list.  Patient reports no complaints.  Contractions: Irritability. Vag. Bleeding: None.  Movement: Present. Denies leaking of fluid.   The following portions of the patient's history were reviewed and updated as appropriate: allergies, current medications, past family history, past medical history, past social history, past surgical history and problem list. Problem list updated.  Objective:   Vitals:   10/24/18 1013  BP: 115/79  Pulse: 74  Weight: 182 lb 6.4 oz (82.7 kg)    Fetal Status:     Movement: Present     General:  Alert, oriented and cooperative. Patient is in no acute distress.  Skin: Skin is warm and dry. No rash noted.   Cardiovascular: Normal heart rate noted  Respiratory: Normal respiratory effort, no problems with respiration noted  Abdomen: Soft, gravid, appropriate for gestational age. Pain/Pressure: Present     Pelvic:  Cervical exam deferred        Extremities: Normal range of motion.  Edema: Trace  Mental Status: Normal mood and affect. Normal behavior. Normal judgment and thought content.   Urinalysis:      Assessment and Plan:  Pregnancy: G3P1011 at [redacted]w[redacted]d  1. Supervision of other normal pregnancy, antepartum  2. Thrombocytopenia affecting pregnancy (HCC)  3. Rh negative state in antepartum period   Preterm labor symptoms and general obstetric precautions including but not limited to vaginal bleeding, contractions, leaking of fluid and fetal movement were reviewed in detail with the patient. Please refer to After Visit Summary for other counseling recommendations.  Return in about 2 weeks (around 11/07/2018) for  ROB.   Brock Bad, MD

## 2018-10-25 ENCOUNTER — Telehealth: Payer: Self-pay

## 2018-10-25 NOTE — Telephone Encounter (Signed)
TC to pt regarding message.    Pt states son has flu she was given Rx for Tamiflu.  Consulted w/ provider per pt request if ok to take.  Per Dr.Constant ok to take Rx.

## 2018-10-27 DIAGNOSIS — F4322 Adjustment disorder with anxiety: Secondary | ICD-10-CM | POA: Diagnosis not present

## 2018-11-07 ENCOUNTER — Ambulatory Visit (INDEPENDENT_AMBULATORY_CARE_PROVIDER_SITE_OTHER): Payer: Medicaid Other | Admitting: Obstetrics

## 2018-11-07 ENCOUNTER — Encounter: Payer: Self-pay | Admitting: Obstetrics

## 2018-11-07 ENCOUNTER — Encounter: Payer: Self-pay | Admitting: *Deleted

## 2018-11-07 VITALS — BP 116/66 | HR 98 | Wt 194.0 lb

## 2018-11-07 DIAGNOSIS — O0990 Supervision of high risk pregnancy, unspecified, unspecified trimester: Secondary | ICD-10-CM

## 2018-11-07 DIAGNOSIS — O99013 Anemia complicating pregnancy, third trimester: Secondary | ICD-10-CM

## 2018-11-07 DIAGNOSIS — O99119 Other diseases of the blood and blood-forming organs and certain disorders involving the immune mechanism complicating pregnancy, unspecified trimester: Secondary | ICD-10-CM

## 2018-11-07 DIAGNOSIS — O99019 Anemia complicating pregnancy, unspecified trimester: Secondary | ICD-10-CM | POA: Diagnosis not present

## 2018-11-07 DIAGNOSIS — D696 Thrombocytopenia, unspecified: Secondary | ICD-10-CM

## 2018-11-07 DIAGNOSIS — K5901 Slow transit constipation: Secondary | ICD-10-CM

## 2018-11-07 DIAGNOSIS — O26893 Other specified pregnancy related conditions, third trimester: Secondary | ICD-10-CM

## 2018-11-07 DIAGNOSIS — Z6791 Unspecified blood type, Rh negative: Secondary | ICD-10-CM

## 2018-11-07 DIAGNOSIS — Z3689 Encounter for other specified antenatal screening: Secondary | ICD-10-CM

## 2018-11-07 DIAGNOSIS — O0993 Supervision of high risk pregnancy, unspecified, third trimester: Secondary | ICD-10-CM

## 2018-11-07 DIAGNOSIS — O26899 Other specified pregnancy related conditions, unspecified trimester: Secondary | ICD-10-CM

## 2018-11-07 MED ORDER — DOCUSATE SODIUM 100 MG PO CAPS: 100.0000 mg | ORAL_CAPSULE | Freq: Two times a day (BID) | ORAL | 0 refills | Status: DC

## 2018-11-07 NOTE — Progress Notes (Signed)
Subjective:  Barbara Cox is a 28 y.o. G3P1011 at 7382w6d being seen today for ongoing prenatal care.  She is currently monitored for the following issues for this high-risk pregnancy and has Supervision of other normal pregnancy, antepartum; Rh negative status during pregnancy; and Thrombocytopenia affecting pregnancy (HCC) on their problem list.  Patient reports fatigue and occasional contractions.  Contractions: Irregular. Vag. Bleeding: None.  Movement: Present. Denies leaking of fluid.   The following portions of the patient's history were reviewed and updated as appropriate: allergies, current medications, past family history, past medical history, past social history, past surgical history and problem list. Problem list updated.  Objective:   Vitals:   11/07/18 1035  BP: 116/66  Pulse: 98  Weight: 194 lb (88 kg)    Fetal Status:     Movement: Present     General:  Alert, oriented and cooperative. Patient is in no acute distress.  Skin: Skin is warm and dry. No rash noted.   Cardiovascular: Normal heart rate noted  Respiratory: Normal respiratory effort, no problems with respiration noted  Abdomen: Soft, gravid, appropriate for gestational age. Pain/Pressure: Present     Pelvic:  Cervical exam deferred        Extremities: Normal range of motion.     Mental Status: Normal mood and affect. Normal behavior. Normal judgment and thought content.   Urinalysis:      Assessment and Plan:  Pregnancy: G3P1011 at 6782w6d  1. Supervision of high risk pregnancy, antepartum  2. Anemia affecting pregnancy, antepartum Rx: - Ferritin - Transferrin - Iron Binding Cap (TIBC)([facility]) - Ambulatory referral to Hematology - US MFM OB FOLLOW UP; Future  3. Thrombocytopenia affecting pregnancy (HCC)  4. Rh negative state in antepartum period - Rhogam postpartum  5. Slow transit constipation Rx: - docusate sodium (COLACE) 100 MG capsule; Take 1 capsule (100 mg total) by mouth 2  (two) times daily.  Dispense: 10 capsule; Refill: 0  6. Encounter for ultrasound to assess interval growth of fetus Rx: - US MFM OB FOLLOW UP; Future  Preterm labor symptoms and general obstetric precautions including but not limited to vaginal bleeding, contractions, leaking of fluid and fetal movement were reviewed in detail with the patient. Please refer to After Visit Summary for other counseling recommendations.  Return in about 1 week (around 11/14/2018) for ROB.   Brock BadHarper, Quincey Quesinberry A, MD

## 2018-11-08 LAB — IRON AND TIBC
Iron Saturation: 6 % — CL (ref 15–55)
Iron: 31 ug/dL (ref 27–159)
Total Iron Binding Capacity: 485 ug/dL — ABNORMAL HIGH (ref 250–450)
UIBC: 454 ug/dL — AB (ref 131–425)

## 2018-11-08 LAB — FERRITIN: FERRITIN: 5 ng/mL — AB (ref 15–150)

## 2018-11-08 LAB — TRANSFERRIN: TRANSFERRIN: 384 mg/dL — AB (ref 200–370)

## 2018-11-10 ENCOUNTER — Encounter: Payer: Self-pay | Admitting: Hematology

## 2018-11-10 ENCOUNTER — Telehealth: Payer: Self-pay | Admitting: Hematology

## 2018-11-10 NOTE — Telephone Encounter (Signed)
A new hem appt has been scheduled for the pt to see Dr. Candise CheKale on 2/6 at 11am. She's been made aware to arrive 30 minutes. Letter mailed.

## 2018-11-13 ENCOUNTER — Ambulatory Visit (HOSPITAL_COMMUNITY)
Admission: RE | Admit: 2018-11-13 | Discharge: 2018-11-13 | Disposition: A | Discharge status: Home / Self Care | Payer: Medicaid Other | Source: Ambulatory Visit | Attending: Obstetrics | Admitting: Obstetrics

## 2018-11-13 DIAGNOSIS — Z3689 Encounter for other specified antenatal screening: Secondary | ICD-10-CM | POA: Insufficient documentation

## 2018-11-13 DIAGNOSIS — Z362 Encounter for other antenatal screening follow-up: Secondary | ICD-10-CM | POA: Diagnosis not present

## 2018-11-13 DIAGNOSIS — Z3A34 34 weeks gestation of pregnancy: Secondary | ICD-10-CM | POA: Diagnosis not present

## 2018-11-13 DIAGNOSIS — O99019 Anemia complicating pregnancy, unspecified trimester: Secondary | ICD-10-CM | POA: Insufficient documentation

## 2018-11-14 DIAGNOSIS — F4322 Adjustment disorder with anxiety: Secondary | ICD-10-CM | POA: Diagnosis not present

## 2018-11-15 ENCOUNTER — Ambulatory Visit (INDEPENDENT_AMBULATORY_CARE_PROVIDER_SITE_OTHER): Payer: Medicaid Other | Admitting: Obstetrics

## 2018-11-15 ENCOUNTER — Other Ambulatory Visit: Payer: Self-pay

## 2018-11-15 ENCOUNTER — Encounter: Payer: Self-pay | Admitting: Obstetrics

## 2018-11-15 VITALS — BP 117/68 | HR 102 | Wt 196.0 lb

## 2018-11-15 DIAGNOSIS — O99119 Other diseases of the blood and blood-forming organs and certain disorders involving the immune mechanism complicating pregnancy, unspecified trimester: Secondary | ICD-10-CM | POA: Diagnosis not present

## 2018-11-15 DIAGNOSIS — O99013 Anemia complicating pregnancy, third trimester: Secondary | ICD-10-CM

## 2018-11-15 DIAGNOSIS — D696 Thrombocytopenia, unspecified: Secondary | ICD-10-CM

## 2018-11-15 DIAGNOSIS — O0990 Supervision of high risk pregnancy, unspecified, unspecified trimester: Secondary | ICD-10-CM

## 2018-11-15 DIAGNOSIS — O99113 Other diseases of the blood and blood-forming organs and certain disorders involving the immune mechanism complicating pregnancy, third trimester: Secondary | ICD-10-CM

## 2018-11-15 DIAGNOSIS — O0993 Supervision of high risk pregnancy, unspecified, third trimester: Secondary | ICD-10-CM

## 2018-11-15 NOTE — Progress Notes (Signed)
HEMATOLOGY/ONCOLOGY CONSULTATION NOTE  Date of Service: 11/16/2018  Patient Care Team: Patient, No Pcp Per as PCP - General (General Practice)  CHIEF COMPLAINTS/PURPOSE OF CONSULTATION:  Anemia in Pregnancy  HISTORY OF PRESENTING ILLNESS:  Barbara Cox is a wonderful 28 y.o. female who has been referred to [place] by OBGYN Dr. Coral Ceo for evaluation and management of Anemia in pregnancy. The pt is currently at [redacted] weeks gestation.   The pt notes that this is her second pregnancy, and her first child is 35 years old. The pt notes that she was slightly anemic before she was pregnant, with HGB in the 11 range. She has never had IV iron, and notes that she has a history of heavy periods. She denies complications with her first pregnancy and notes it was "normal."   The pt notes that she feels very weak and feels light headed today, but has had breakfast and has been staying hydrated. The pt has been taking prenatal vitamins and began a PO iron supplement only 4 weeks ago. She denies taking iron replacement before this. The pt denies concerns for bleeding at this time. She has been having contractions since last night, which she notes are "at least 15-16 minute apart." She notes that these contractions have been quick.   The pt reports that she has not had any other medical concerns.   Most recent lab results (11/15/18) of CBC is as follows: all values are WNL except for RBC at 3.18, HGB at 7.4, HCT at 24.5, MCV at 77, MCH at 23.3, MCHC at 30.2, RDW at 15.5, PLt at 98k 11/07/18 Ferritin at 5  On review of systems, pt reports light headedness, dizziness, low energy levels, feeling weak, and denies vaginal bleeding, blood in the stools, concerns for bleeding, and any other symptoms.   On PMHx the pt reports anemia.  MEDICAL HISTORY:  Past Medical History:  Diagnosis Date  . Anemia     SURGICAL HISTORY: Past Surgical History:  Procedure Laterality Date  . NO PAST SURGERIES      SOCIAL  HISTORY: Social History   Socioeconomic History  . Marital status: Single    Spouse name: Not on file  . Number of children: Not on file  . Years of education: Not on file  . Highest education level: Not on file  Occupational History  . Not on file  Social Needs  . Financial resource strain: Not on file  . Food insecurity:    Worry: Not on file    Inability: Not on file  . Transportation needs:    Medical: Not on file    Non-medical: Not on file  Tobacco Use  . Smoking status: Former Smoker    Last attempt to quit: 07/20/2018    Years since quitting: 0.3  . Smokeless tobacco: Never Used  . Tobacco comment: occasional (5 in the past week)  Substance and Sexual Activity  . Alcohol use: No  . Drug use: Never  . Sexual activity: Yes  Lifestyle  . Physical activity:    Days per week: Not on file    Minutes per session: Not on file  . Stress: Not on file  Relationships  . Social connections:    Talks on phone: Not on file    Gets together: Not on file    Attends religious service: Not on file    Active member of club or organization: Not on file    Attends meetings of clubs or organizations: Not  on file    Relationship status: Not on file  . Intimate partner violence:    Fear of current or ex partner: Not on file    Emotionally abused: Not on file    Physically abused: Not on file    Forced sexual activity: Not on file  Other Topics Concern  . Not on file  Social History Narrative  . Not on file    FAMILY HISTORY: Family History  Problem Relation Age of Onset  . Leukemia Father   . Breast cancer Sister     ALLERGIES:  is allergic to peanuts [peanut oil].  MEDICATIONS:  Current Outpatient Medications  Medication Sig Dispense Refill  . docusate sodium (COLACE) 100 MG capsule Take 1 capsule (100 mg total) by mouth 2 (two) times daily. 10 capsule 0  . Doxylamine-Pyridoxine (DICLEGIS) 10-10 MG TBEC 1 tab in AM, 1 tab mid afternoon 2 tabs at bedtime. Max dose 4  tabs daily. 100 tablet 5  . Elastic Bandages & Supports (COMFORT FIT MATERNITY SUPP MED) MISC 1 Device by Does not apply route daily. 1 each 0  . ferrous sulfate 325 (65 FE) MG tablet Take 1 tablet (325 mg total) by mouth 3 (three) times daily with meals. 90 tablet 5  . iron polysaccharides (NIFEREX) 150 MG capsule Take 1 capsule (150 mg total) by mouth 2 (two) times daily. 60 capsule 2  . Prenat-Fe Poly-Methfol-FA-DHA (VITAFOL ULTRA) 29-0.6-0.4-200 MG CAPS Take 1 tablet by mouth daily before breakfast. 90 capsule 4   No current facility-administered medications for this visit.     REVIEW OF SYSTEMS:    10 Point review of Systems was done is negative except as noted above.  PHYSICAL EXAMINATION:  . Vitals:   11/16/18 1109  BP: 117/68  Pulse: 91  Resp: 20  Temp: 98.4 F (36.9 C)  SpO2: 100%   Filed Weights   11/16/18 1109  Weight: 193 lb 12.8 oz (87.9 kg)   .Body mass index is 35.45 kg/m.  GENERAL:alert, in no acute distress and comfortable SKIN: no acute rashes, no significant lesions EYES: conjunctiva are pink and non-injected, sclera anicteric OROPHARYNX: MMM, no exudates, no oropharyngeal erythema or ulceration NECK: supple, no JVD LYMPH:  no palpable lymphadenopathy in the cervical, axillary or inguinal regions LUNGS: clear to auscultation b/l with normal respiratory effort HEART: regular rate & rhythm ABDOMEN:  normoactive bowel sounds , non tender, gravid uterus. Extremity: no pedal edema PSYCH: alert & oriented x 3 with fluent speech NEURO: no focal motor/sensory deficits  LABORATORY DATA:  I have reviewed the data as listed  . CBC Latest Ref Rng & Units 11/15/2018 10/20/2018 10/13/2018  WBC 3.4 - 10.8 x10E3/uL 6.9 5.1 5.0  Hemoglobin 11.1 - 15.9 g/dL 7.4(L) 8.4(L) 8.3(L)  Hematocrit 34.0 - 46.6 % 24.5(L) 27.7(L) 26.5(L)  Platelets 150 - 450 x10E3/uL 98(LL) 95(L) 105(L)    . CMP Latest Ref Rng & Units 12/30/2015  Glucose 65 - 99 mg/dL 83  BUN 6 - 20 mg/dL 9   Creatinine 1.61 - 0.96 mg/dL 0.45  Sodium 409 - 811 mmol/L 137  Potassium 3.5 - 5.1 mmol/L 3.8  Chloride 101 - 111 mmol/L 104  CO2 22 - 32 mmol/L 22  Calcium 8.9 - 10.3 mg/dL 9.2     RADIOGRAPHIC STUDIES: I have personally reviewed the radiological images as listed and agreed with the findings in the report. [place] Mfm Ob Follow Up  Result Date: 11/13/2018 ----------------------------------------------------------------------  OBSTETRICS REPORT                       (  Signed Final 11/13/2018 02:55 pm) ---------------------------------------------------------------------- Patient Info  ID #:       [id number]                          D.O.B.:  09/04/1991 (27 yrs)  Name:       Barbara Cox                    Visit Date: 11/13/2018 08:27 am ---------------------------------------------------------------------- Performed By  Performed By:     Percell BostonHeather Waken          Ref. Address:      [address]                    [address]                                                              [address]                                                              [address]                                                              [place]                                                              [phone]  Attending:        Lin Landsmanorenthian Booker      [facility]                    MD  Referred By:      [facility] ---------------------------------------------------------------------- Orders   #  Description                          Code         Ordered By   1  US MFM OB FOLLOW UP                  [id number]     Coral CeoHARLES HARPER  ----------------------------------------------------------------------   #  Order #                    Accession #  Episode #   1  [id number]                  [id number]                  [id number]  ---------------------------------------------------------------------- Indications   [redacted] weeks gestation of  pregnancy                Z3A.34   Anemia during pregnancy in third trimester     O99.013   Encounter for other antenatal screening        Z36.2   follow-up  ---------------------------------------------------------------------- Fetal Evaluation  Num Of Fetuses:          1  Fetal Heart Rate(bpm):   130  Cardiac Activity:        Observed  Presentation:            Cephalic  Placenta:                Posterior  P. Cord Insertion:       Previously Visualized  Amniotic Fluid  AFI FV:      Within normal limits  AFI Sum(cm)     %Tile       Largest Pocket(cm)  11.9            34          3.27  RUQ(cm)       RLQ(cm)       LUQ(cm)        LLQ(cm)  2.62          2.96          3.05           3.27 ---------------------------------------------------------------------- Biometry  BPD:      80.9  mm     G. Age:  32w 3d          4  %    CI:        76.38   %    70 - 86                                                          FL/HC:       21.0  %    20.1 - 22.3  HC:      293.3  mm     G. Age:  32w 3d        < 3  %    HC/AC:       1.01       0.93 - 1.11  AC:      291.7  mm     G. Age:  33w 1d         16  %    FL/BPD:      76.0  %    71 - 87  FL:       61.5  mm     G. Age:  31w 6d        < 3  %    FL/AC:       21.1  %    20 - 24  HUM:      56.2  mm     G. Age:  32w 5d         22  %  Est. FW:  2022   gm     4 lb 7 oz     22  % ---------------------------------------------------------------------- OB History  Gravidity:    3         Term:   1  TOP:          1        Living:  1 ---------------------------------------------------------------------- Gestational Age  Clinical EDD:  34w 5d                                        EDD:   12/20/18  U/S Today:     32w 3d                                        EDD:   01/05/19  Best:          34w 5d     Det. By:  Clinical EDD             EDD:   12/20/18 ---------------------------------------------------------------------- Anatomy  Cranium:               Appears normal         LVOT:                    Appears normal  Cavum:                 Previously seen        Aortic Arch:            Previously seen  Ventricles:            Appears normal         Ductal Arch:            Previously seen  Choroid Plexus:        Previously seen        Diaphragm:              Previously seen  Cerebellum:            Previously seen        Stomach:                Appears normal, left                                                                        sided  Posterior Fossa:       Previously seen        Abdomen:                Previously seen  Nuchal Fold:           Previously seen        Abdominal Wall:         Previously seen  Face:                  Orbits and profile     Cord Vessels:           Previously seen  previously seen  Lips:                  Previously seen        Kidneys:                Appear normal  Palate:                Previously seen        Bladder:                Appears normal  Thoracic:              Appears normal         Spine:                  Previously seen  Heart:                 Appears normal         Upper Extremities:      Previously seen                         (4CH, axis, and                         situs)  RVOT:                  Appears normal         Lower Extremities:      Previously seen  Other:  Female gender Nasal bone visualized previously. Heels and 5th digit          visualized previously. ---------------------------------------------------------------------- Cervix Uterus Adnexa  Cervix  Not visualized (advanced GA >24wks) ---------------------------------------------------------------------- Impression  Normal interval growth. ---------------------------------------------------------------------- Recommendations  Follow up as clinically indicated. ----------------------------------------------------------------------               Lin Landsman, MD Electronically Signed Final Report   11/13/2018 02:55 pm  ----------------------------------------------------------------------   ASSESSMENT & PLAN:   28 y.o. female with  1. Anemia in pregnancy 2. Severe Iron deficiency due to pregnancy and previously from heavy periods. 3. Thrombocytopenia due to pregnancy (Gestational thrombocytopenia) PLT at 98k, which is not overtly concerning at this stage of pregnancy PLAN  -Discussed patient's most recent labs, 11/15/18 HGB at 7.4, . 11/07/18 Ferritin at 5 and 6% iron saturation -Pt is overtly iron deficient, and is experiencing overt symptomatic anemia and is overtly dizzy at this time. -Since the pt is nearly at term, IV iron will not offer a fast enough response for the pt currently. -Recommend 2 units of PRBCs today at [facility] f(or appropriate monitoring). For symptomatic anemia. -Begin 150mg  Iron Polysaccharide BID  -Will see the pt back in 8 weeks, after delivery, for indications of IV iron  -[facility] -RTC with Dr Candise Che in 8 weeks with labs today re-assess Iron deficiency and consider IV Iron    All of the patients questions were answered with apparent satisfaction. The patient knows to call the clinic with any problems, questions or concerns.  The total time spent in the appt was 35 minutes and more than 50% was on counseling and direct patient cares.    Wyvonnia Lora MD MS AAHIVMS [facility] Hematology/Oncology Physician [facility]  (Office):       [phone] (Work cell):  [phone] (Fax):           [phone]  11/16/2018 12:15 PM  I, Marcelline Mates, am acting as a scribe for Dr. Wyvonnia Lora.   .I have reviewed the above documentation for accuracy and completeness, and I agree with the above. Johney Maine MD

## 2018-11-15 NOTE — Progress Notes (Signed)
Subjective:  Barbara Cox is a 28 y.o. G3P1011 at [redacted]w[redacted]d being seen today for ongoing prenatal care.  She is currently monitored for the following issues for this high-risk pregnancy and has Supervision of other normal pregnancy, antepartum; Rh negative status during pregnancy; and Thrombocytopenia affecting pregnancy (HCC) on their problem list.  Patient reports backache.  Contractions: Irregular. Vag. Bleeding: None.  Movement: Present. Denies leaking of fluid.   The following portions of the patient's history were reviewed and updated as appropriate: allergies, current medications, past family history, past medical history, past social history, past surgical history and problem list. Problem list updated.  Objective:   Vitals:   11/15/18 1048  BP: 117/68  Pulse: (!) 102  Weight: 196 lb (88.9 kg)    Fetal Status:     Movement: Present     General:  Alert, oriented and cooperative. Patient is in no acute distress.  Skin: Skin is warm and dry. No rash noted.   Cardiovascular: Normal heart rate noted  Respiratory: Normal respiratory effort, no problems with respiration noted  Abdomen: Soft, gravid, appropriate for gestational age. Pain/Pressure: Present     Pelvic:  Cervical exam deferred        Extremities: Normal range of motion.  Edema: Trace  Mental Status: Normal mood and affect. Normal behavior. Normal judgment and thought content.   Urinalysis:      Labs:  Results for KIRANA, PLANAS (MRN [id number]) as of 11/15/2018 10:56  Results for KALAINA, PUOPOLO (MRN [id number]) as of 11/15/2018 10:56  Ref. Range 10/20/2018 17:04  WBC Latest Ref Range: 4.0 - 10.5 K/uL 5.1  RBC Latest Ref Range: 3.87 - 5.11 MIL/uL 3.37 (L)  Hemoglobin Latest Ref Range: 12.0 - 15.0 g/dL 8.4 (L)  HCT Latest Ref Range: 36.0 - 46.0 % 27.7 (L)  MCV Latest Ref Range: 80.0 - 100.0 fL 82.2  MCH Latest Ref Range: 26.0 - 34.0 pg 24.9 (L)  MCHC Latest Ref Range: 30.0 - 36.0 g/dL 84.5  RDW Latest Ref Range: 11.5 - 15.5 %  15.5  Platelets Latest Ref Range: 150 - 400 K/uL 95 (L)  nRBC Latest Ref Range: 0.0 - 0.2 % 0.0    Ref. Range 11/07/2018 11:04  Iron Latest Ref Range: 27 - 159 ug/dL 31  UIBC Latest Ref Range: 131 - 425 ug/dL 364 (H)  TIBC Latest Ref Range: 250 - 450 ug/dL 680 (H)  Ferritin Latest Ref Range: 15 - 150 ng/mL 5 (L)  Iron Saturation Latest Ref Range: 15 - 55 % 6 (LL)  Transferrin Latest Ref Range: 200 - 370 mg/dL 321 (H)   Assessment and Plan:  Pregnancy: G3P1011 at [redacted]w[redacted]d  1. Supervision of high risk pregnancy, antepartum  2. Anemia affecting pregnancy in third trimester Rx: - AMB referral to maternal fetal medicine  3. Thrombocytopenia affecting pregnancy, antepartum (HCC) Rx: - AMB referral to maternal fetal medicine  Preterm labor symptoms and general obstetric precautions including but not limited to vaginal bleeding, contractions, leaking of fluid and fetal movement were reviewed in detail with the patient. Please refer to After Visit Summary for other counseling recommendations.  Return in about 1 week (around 11/22/2018) for ROB.   Brock Bad, MD

## 2018-11-15 NOTE — Progress Notes (Signed)
ROB.  C/o pressure/back pain.

## 2018-11-16 ENCOUNTER — Other Ambulatory Visit: Payer: Self-pay

## 2018-11-16 ENCOUNTER — Inpatient Hospital Stay (HOSPITAL_COMMUNITY)
Admission: AD | Admit: 2018-11-16 | Discharge: 2018-11-17 | DRG: 832 | Disposition: A | Discharge status: Home / Self Care | Payer: Medicaid Other | Attending: Obstetrics and Gynecology | Admitting: Obstetrics and Gynecology

## 2018-11-16 ENCOUNTER — Encounter (HOSPITAL_COMMUNITY): Payer: Self-pay

## 2018-11-16 ENCOUNTER — Inpatient Hospital Stay (HOSPITAL_BASED_OUTPATIENT_CLINIC_OR_DEPARTMENT_OTHER): Payer: Medicaid Other | Admitting: Hematology

## 2018-11-16 VITALS — BP 117/68 | HR 91 | Temp 98.4°F | Resp 20 | Ht 62.0 in | Wt 193.8 lb

## 2018-11-16 DIAGNOSIS — D649 Anemia, unspecified: Secondary | ICD-10-CM | POA: Diagnosis not present

## 2018-11-16 DIAGNOSIS — O99113 Other diseases of the blood and blood-forming organs and certain disorders involving the immune mechanism complicating pregnancy, third trimester: Secondary | ICD-10-CM | POA: Diagnosis present

## 2018-11-16 DIAGNOSIS — Z3A35 35 weeks gestation of pregnancy: Secondary | ICD-10-CM | POA: Diagnosis not present

## 2018-11-16 DIAGNOSIS — D509 Iron deficiency anemia, unspecified: Secondary | ICD-10-CM | POA: Diagnosis not present

## 2018-11-16 DIAGNOSIS — O26893 Other specified pregnancy related conditions, third trimester: Secondary | ICD-10-CM | POA: Diagnosis not present

## 2018-11-16 DIAGNOSIS — O99013 Anemia complicating pregnancy, third trimester: Principal | ICD-10-CM | POA: Diagnosis present

## 2018-11-16 DIAGNOSIS — Z79899 Other long term (current) drug therapy: Secondary | ICD-10-CM | POA: Insufficient documentation

## 2018-11-16 DIAGNOSIS — D508 Other iron deficiency anemias: Secondary | ICD-10-CM

## 2018-11-16 DIAGNOSIS — D6959 Other secondary thrombocytopenia: Secondary | ICD-10-CM | POA: Diagnosis present

## 2018-11-16 DIAGNOSIS — Z87891 Personal history of nicotine dependence: Secondary | ICD-10-CM | POA: Diagnosis not present

## 2018-11-16 DIAGNOSIS — E611 Iron deficiency: Secondary | ICD-10-CM

## 2018-11-16 DIAGNOSIS — Z6791 Unspecified blood type, Rh negative: Secondary | ICD-10-CM | POA: Diagnosis not present

## 2018-11-16 DIAGNOSIS — Z3A36 36 weeks gestation of pregnancy: Secondary | ICD-10-CM | POA: Diagnosis not present

## 2018-11-16 DIAGNOSIS — O0973 Supervision of high risk pregnancy due to social problems, third trimester: Secondary | ICD-10-CM | POA: Diagnosis not present

## 2018-11-16 DIAGNOSIS — O0970 Supervision of high risk pregnancy due to social problems, unspecified trimester: Secondary | ICD-10-CM

## 2018-11-16 DIAGNOSIS — D696 Thrombocytopenia, unspecified: Secondary | ICD-10-CM | POA: Diagnosis not present

## 2018-11-16 LAB — BASIC METABOLIC PANEL
Anion gap: 6 (ref 5–15)
BUN: 7 mg/dL (ref 6–20)
CALCIUM: 8.9 mg/dL (ref 8.9–10.3)
CO2: 22 mmol/L (ref 22–32)
Chloride: 107 mmol/L (ref 98–111)
Creatinine, Ser: 0.64 mg/dL (ref 0.44–1.00)
GFR calc Af Amer: >60 mL/min (ref >60)
GFR calc non Af Amer: >60 mL/min (ref >60)
Glucose, Bld: 105 mg/dL — ABNORMAL HIGH (ref 70–99)
Potassium: 3.9 mmol/L (ref 3.5–5.1)
Sodium: 135 mmol/L (ref 135–145)

## 2018-11-16 LAB — CBC
HCT: 26.9 % — ABNORMAL LOW (ref 36.0–46.0)
Hematocrit: 24.5 % — ABNORMAL LOW (ref 34.0–46.6)
Hemoglobin: 7.4 g/dL — ABNORMAL LOW (ref 11.1–15.9)
Hemoglobin: 7.7 g/dL — ABNORMAL LOW (ref 12.0–15.0)
MCH: 23.3 pg — ABNORMAL LOW (ref 26.6–33.0)
MCH: 23.2 pg — ABNORMAL LOW (ref 26.0–34.0)
MCHC: 30.2 g/dL — ABNORMAL LOW (ref 31.5–35.7)
MCHC: 28.6 g/dL — ABNORMAL LOW (ref 30.0–36.0)
MCV: 77 fL — AB (ref 79–97)
MCV: 81 fL (ref 80.0–100.0)
PLATELETS: 98 10*3/uL — AB (ref 150–450)
PLATELETS: 106 10*3/uL — AB (ref 150–400)
RBC: 3.18 x10E6/uL — AB (ref 3.77–5.28)
RBC: 3.32 MIL/uL — ABNORMAL LOW (ref 3.87–5.11)
RDW: 15.5 % — AB (ref 11.7–15.4)
RDW: 16.9 % — ABNORMAL HIGH (ref 11.5–15.5)
WBC: 6.9 10*3/uL (ref 3.4–10.8)
WBC: 7.7 10*3/uL (ref 4.0–10.5)
nRBC: 0 % (ref 0.0–0.2)

## 2018-11-16 LAB — PREPARE RBC (CROSSMATCH)

## 2018-11-16 MED ORDER — CALCIUM CARBONATE ANTACID 500 MG PO CHEW: 2.0000 | CHEWABLE_TABLET | ORAL | Status: DC | PRN

## 2018-11-16 MED ORDER — POLYSACCHARIDE IRON COMPLEX 150 MG PO CAPS: 150.0000 mg | ORAL_CAPSULE | Freq: Two times a day (BID) | ORAL | 2 refills | Status: DC

## 2018-11-16 MED ORDER — SODIUM CHLORIDE 0.9% IV SOLUTION
Freq: Once | INTRAVENOUS | Status: AC
  Administered 2018-11-16: 18:00:00 via INTRAVENOUS

## 2018-11-16 MED ORDER — POLYSACCHARIDE IRON COMPLEX 150 MG PO CAPS
150.0000 mg | ORAL_CAPSULE | Freq: Two times a day (BID) | ORAL | Status: DC
  Administered 2018-11-16 – 2018-11-17 (×2): 150 mg via ORAL
  Filled 2018-11-16 (×2): qty 1

## 2018-11-16 MED ORDER — ACETAMINOPHEN 325 MG PO TABS
650.0000 mg | ORAL_TABLET | ORAL | Status: DC | PRN
  Administered 2018-11-16: 650 mg via ORAL
  Filled 2018-11-16: qty 2

## 2018-11-16 MED ORDER — PRENATAL MULTIVITAMIN CH
1.0000 | ORAL_TABLET | Freq: Every day | ORAL | Status: DC
  Filled 2018-11-16: qty 1

## 2018-11-16 MED ORDER — ZOLPIDEM TARTRATE 5 MG PO TABS: 5.0000 mg | ORAL_TABLET | Freq: Every evening | ORAL | Status: DC | PRN

## 2018-11-16 MED ORDER — POLYETHYLENE GLYCOL 3350 17 G PO PACK
17.0000 g | PACK | Freq: Every day | ORAL | Status: DC
  Administered 2018-11-16 – 2018-11-17 (×2): 17 g via ORAL
  Filled 2018-11-16 (×4): qty 1
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
## 2018-11-16 NOTE — Progress Notes (Signed)
Per Dr. Candise Che request - contacted [facility] ED charge nurse to inform that patient coming to ED from Dr. Candise Che exam room with Hgb 7.4 in need of transfusion. ED will evaluate patient and need for transfer to [facility].  Patient transported to [facility] ED triage by wheelchair, accompanied by RN and NT.

## 2018-11-16 NOTE — MAU Provider Note (Signed)
History     CSN: [id number]  Arrival date and time: 11/16/18 1225   First Provider Initiated Contact with Patient 11/16/18 1437      Chief Complaint  Patient presents with  . Weakness  . [redacted] weeks pregnant   Barbara Cox is a 28 y.o. G3P1011 at 3298w1d who presents for Weakness and [redacted] weeks pregnant.  She reports going to hematology this morning and reporting some dizziness. She states she was then transferred to the ED for a blood transfusion, but was told it could not be done their because they do not have baby monitors.  Patient endorses continued dizziness and fatigue for the last week.  She also endorses light headedness, headaches, and feeling of syncope without incidents. Patient denies current contractions, but reports having them yesterday.  She endorses fetal movement and denies LOF and VB.  Patient expresses desire for delivery and states she would like this to occur today.      OB History    Gravida  3   Para  1   Term  1   Preterm      AB  1   Living  1     SAB      TAB  1   Ectopic      Multiple      Live Births  1           Past Medical History:  Diagnosis Date  . Anemia     Past Surgical History:  Procedure Laterality Date  . NO PAST SURGERIES      Family History  Problem Relation Age of Onset  . Leukemia Father   . Breast cancer Sister     Social History   Tobacco Use  . Smoking status: Former Smoker    Last attempt to quit: 07/20/2018    Years since quitting: 0.3  . Smokeless tobacco: Never Used  . Tobacco comment: occasional (5 in the past week)  Substance Use Topics  . Alcohol use: No  . Drug use: Never    Allergies:  Allergies  Allergen Reactions  . Peanuts [Peanut Oil]     Anaphylaxis     Medications Prior to Admission  Medication Sig Dispense Refill Last Dose  . docusate sodium (COLACE) 100 MG capsule Take 1 capsule (100 mg total) by mouth 2 (two) times daily. 10 capsule 0   . Doxylamine-Pyridoxine (DICLEGIS)  10-10 MG TBEC 1 tab in AM, 1 tab mid afternoon 2 tabs at bedtime. Max dose 4 tabs daily. 100 tablet 5 Taking  . Elastic Bandages & Supports (COMFORT FIT MATERNITY SUPP MED) MISC 1 Device by Does not apply route daily. 1 each 0 Taking  . ferrous sulfate 325 (65 FE) MG tablet Take 1 tablet (325 mg total) by mouth 3 (three) times daily with meals. 90 tablet 5 Taking  . iron polysaccharides (NIFEREX) 150 MG capsule Take 1 capsule (150 mg total) by mouth 2 (two) times daily. 60 capsule 2   . Prenat-Fe Poly-Methfol-FA-DHA (VITAFOL ULTRA) 29-0.6-0.4-200 MG CAPS Take 1 tablet by mouth daily before breakfast. 90 capsule 4 Taking    Review of Systems  Constitutional: Negative for chills and fever.  Respiratory: Positive for shortness of breath.   Cardiovascular: Negative for chest pain.  Genitourinary: Negative for dysuria, vaginal bleeding and vaginal discharge.  Musculoskeletal: Positive for back pain.  Neurological: Positive for dizziness, light-headedness and headaches (This morning, but does not treat.).   Physical Exam   Blood pressure (!) 109/56,  pulse (!) 104, temperature 98.5 F (36.9 C), resp. rate 16, height 5\' 2"  (1.575 m), weight 87.9 kg, last menstrual period 03/15/2018, SpO2 94 %.  Physical Exam  Constitutional: She is oriented to person, place, and time. She appears well-developed and well-nourished. She appears lethargic. She is cooperative. No distress.  HENT:  Head: Normocephalic and atraumatic.  Eyes: Conjunctivae are normal.  Neck: Normal range of motion.  Cardiovascular: Normal rate, regular rhythm and normal heart sounds.  Respiratory: Effort normal and breath sounds normal.  GI: Soft. Bowel sounds are normal.  Gravid--fundal height appears AGA, Soft, NT   Musculoskeletal: Normal range of motion.        General: No edema.  Neurological: She is oriented to person, place, and time. She appears lethargic.  Skin: Skin is warm and dry.  Psychiatric: She has a normal mood  and affect. Her behavior is normal.    MAU Course  Procedures Results for orders placed or performed during the hospital encounter of 11/16/18 (from the past 24 hour(s))  CBC     Status: Abnormal   Collection Time: 11/16/18  1:04 PM  Result Value Ref Range   WBC 7.7 4.0 - 10.5 K/uL   RBC 3.32 (L) 3.87 - 5.11 MIL/uL   Hemoglobin 7.7 (L) 12.0 - 15.0 g/dL   HCT 45.3 (L) 64.6 - 80.3 %   MCV 81.0 80.0 - 100.0 fL   MCH 23.2 (L) 26.0 - 34.0 pg   MCHC 28.6 (L) 30.0 - 36.0 g/dL   RDW 21.2 (H) 24.8 - 25.0 %   Platelets 106 (L) 150 - 400 K/uL   nRBC 0.0 0.0 - 0.2 %  Basic metabolic panel     Status: Abnormal   Collection Time: 11/16/18  1:04 PM  Result Value Ref Range   Sodium 135 135 - 145 mmol/L   Potassium 3.9 3.5 - 5.1 mmol/L   Chloride 107 98 - 111 mmol/L   CO2 22 22 - 32 mmol/L   Glucose, Bld 105 (H) 70 - 99 mg/dL   BUN 7 6 - 20 mg/dL   Creatinine, Ser 0.37 0.44 - 1.00 mg/dL   Calcium 8.9 8.9 - 04.8 mg/dL   GFR calc non Af Amer >60 >60 mL/min   GFR calc Af Amer >60 >60 mL/min   Anion gap 6 5 - 15   FHT: 125 bpm, Mod Var, -Decels, +Accels Toco: Occ Ctx  MDM Physical Exam EFM  Assessment and Plan  35.1wks Cat I FT Symptomatic Anemia Gestational Thrombocytopenia  -Patient informed that delivery would not occur today. -Dr. Vergie Living called and informed of patient arrival. Advised: *Give recommendation for blood transfusion based on Dr. Dalbert Batman POC. *If agreeable orders to be placed for admit and transfusion -Patient agreeable to transfusion and w/o q/c. -Will plan for admission to OB HR, Dr.Pickens to assume care.   Cherre Robins MSN, CNM 11/16/2018, 2:38 PM

## 2018-11-16 NOTE — ED Provider Notes (Signed)
[facility]-EMERGENCY DEPT Provider Note   CSN: [id number] Arrival date & time: 11/16/18  1225     History   Chief Complaint Chief Complaint  Patient presents with  . Weakness  . [redacted] weeks pregnant    HPI Magdala Ratliff is a 28 y.o. female.  HPI Patient is [redacted] weeks pregnant and referred to heme/onc for anemia.  States she is been having lightheadedness with standing and generalized fatigue.  She denies any overt bleeding including vaginal bleeding, hematuria or blood in her stool.  Has a history of previous anemia due to heavy periods.  Never received blood transfusion.  Patient states she has not had any loss of fluids and having frequent fetal movement.  She has had lower abdominal cramping but this is infrequent. Past Medical History:  Diagnosis Date  . Anemia     Patient Active Problem List   Diagnosis Date Noted  . Thrombocytopenia affecting pregnancy (HCC) 10/20/2018  . Rh negative status during pregnancy 07/04/2018  . Supervision of other normal pregnancy, antepartum 06/06/2018    Past Surgical History:  Procedure Laterality Date  . NO PAST SURGERIES       OB History    Gravida  3   Para  1   Term  1   Preterm      AB  1   Living  1     SAB      TAB  1   Ectopic      Multiple      Live Births  1            Home Medications    Prior to Admission medications   Medication Sig Start Date End Date Taking? Authorizing Provider  docusate sodium (COLACE) 100 MG capsule Take 1 capsule (100 mg total) by mouth 2 (two) times daily. 11/07/18   Brock Bad, MD  Doxylamine-Pyridoxine (DICLEGIS) 10-10 MG TBEC 1 tab in AM, 1 tab mid afternoon 2 tabs at bedtime. Max dose 4 tabs daily. 06/06/18   Brock Bad, MD  Elastic Bandages & Supports (COMFORT FIT MATERNITY SUPP MED) MISC 1 Device by Does not apply route daily. 10/20/18   Donette Larry, CNM  ferrous sulfate 325 (65 FE) MG tablet Take 1 tablet (325 mg total) by mouth 3  (three) times daily with meals. 10/16/18   Brock Bad, MD  iron polysaccharides (NIFEREX) 150 MG capsule Take 1 capsule (150 mg total) by mouth 2 (two) times daily. 11/16/18   Johney Maine, MD  Prenat-Fe Poly-Methfol-FA-DHA (VITAFOL ULTRA) 29-0.6-0.4-200 MG CAPS Take 1 tablet by mouth daily before breakfast. 07/04/18   Brock Bad, MD    Family History Family History  Problem Relation Age of Onset  . Leukemia Father   . Breast cancer Sister     Social History Social History   Tobacco Use  . Smoking status: Former Smoker    Last attempt to quit: 07/20/2018    Years since quitting: 0.3  . Smokeless tobacco: Never Used  . Tobacco comment: occasional (5 in the past week)  Substance Use Topics  . Alcohol use: No  . Drug use: Never     Allergies   Peanuts [peanut oil]   Review of Systems Review of Systems  Constitutional: Negative for chills and fever.  Respiratory: Negative for cough and shortness of breath.   Cardiovascular: Negative for chest pain.  Gastrointestinal: Positive for abdominal pain. Negative for constipation, diarrhea, nausea and vomiting.  Genitourinary:  Negative for difficulty urinating, dysuria and flank pain.  Musculoskeletal: Negative for back pain, myalgias and neck pain.  Skin: Negative for rash and wound.  Neurological: Positive for light-headedness. Negative for weakness, numbness and headaches.  All other systems reviewed and are negative.    Physical Exam Updated Vital Signs BP 111/66   Pulse (!) 129   Temp 98.2 F (36.8 C)   Resp 18   Ht 5\' 2"  (1.575 m)   Wt 87.9 kg   LMP 03/15/2018 Comment: pt states that her perion 6/10 was light. one before 5/16  SpO2 94%   BMI 35.44 kg/m   Physical Exam Vitals signs and nursing note reviewed.  Constitutional:      General: She is not in acute distress.    Appearance: Normal appearance. She is well-developed. She is not ill-appearing.  HENT:     Head: Normocephalic and  atraumatic.     Nose: Nose normal. No congestion.     Mouth/Throat:     Mouth: Mucous membranes are moist.  Eyes:     Pupils: Pupils are equal, round, and reactive to light.  Neck:     Musculoskeletal: Normal range of motion and neck supple. No neck rigidity or muscular tenderness.     Vascular: No carotid bruit.  Cardiovascular:     Rate and Rhythm: Normal rate and regular rhythm.     Heart sounds: No murmur. No friction rub. No gallop.   Pulmonary:     Effort: Pulmonary effort is normal. No respiratory distress.     Breath sounds: Normal breath sounds. No stridor. No wheezing, rhonchi or rales.  Chest:     Chest wall: No tenderness.  Abdominal:     General: Bowel sounds are normal.     Palpations: Abdomen is soft.     Tenderness: There is no abdominal tenderness. There is no guarding or rebound.     Comments: Gravid abdomen.  Nontender to palpation.  Musculoskeletal: Normal range of motion.        General: No swelling, tenderness, deformity or signs of injury.     Left lower leg: No edema.  Lymphadenopathy:     Cervical: No cervical adenopathy.  Skin:    General: Skin is warm and dry.     Capillary Refill: Capillary refill takes less than 2 seconds.     Findings: No erythema or rash.  Neurological:     General: No focal deficit present.     Mental Status: She is alert and oriented to person, place, and time.  Psychiatric:        Behavior: Behavior normal.      ED Treatments / Results  Labs (all labs ordered are listed, but only abnormal results are displayed) Labs Reviewed  CBC - Abnormal; Notable for the following components:      Result Value   RBC 3.32 (*)    Hemoglobin 7.7 (*)    HCT 26.9 (*)    MCH 23.2 (*)    MCHC 28.6 (*)    RDW 16.9 (*)    Platelets 106 (*)    All other components within normal limits  BASIC METABOLIC PANEL - Abnormal; Notable for the following components:   Glucose, Bld 105 (*)    All other components within normal limits     EKG None  Radiology No results found.  Procedures Procedures (including critical care time)  Medications Ordered in ED Medications - No data to display   Initial Impression / Assessment and Plan /  ED Course  I have reviewed the triage vital signs and the nursing notes.  Pertinent labs & imaging results that were available during my care of the patient were reviewed by me and considered in my medical decision making (see chart for details).     Fetal heart tones in the 150s.  Discussed with OB/GYN on-call.  Dr. Vergie Living[place]ens will accept transfer to [facility].  Final Clinical Impressions(s) / ED Diagnoses   Final diagnoses:  Symptomatic anemia    ED Discharge Orders    None       Loren RacerYelverton, Eathel Pajak, MD 11/16/18 1551

## 2018-11-16 NOTE — ED Notes (Signed)
Called [facility]. Pt to be tx to [facility] under Dr Vergie Living. Doppler and labs requested. MD made aware.

## 2018-11-16 NOTE — H&P (Signed)
Obstetrics Admission History & Physical  11/16/2018 - 4:37 PM Primary OBGYN: [facility]  Chief Complaint: symptomatic anemia  History of Present Illness  28 y.o. G3P1011 @ [redacted]w[redacted]d, with the above CC. Pregnancy complicated by: chronic anemia, gest thrombocytopenia, rh neg.  Patient at [facility] today for Heme appt due to chronic anemia and plts and was symptomatic. Heme recommend two units of PRBCs.  Pt seen in ED there and then transferred here b/c blood transfusions in pregnancy are only done at [facility].   Patient denies any UCs, VB or decreased FM.   Review of Systems:  as noted in the History of Present Illness.  Patient Active Problem List   Diagnosis Date Noted  . Symptomatic anemia 11/16/2018  . Thrombocytopenia affecting pregnancy (HCC) 10/20/2018  . Rh negative status during pregnancy 07/04/2018  . Supervision of other normal pregnancy, antepartum 06/06/2018    PMHx:  Past Medical History:  Diagnosis Date  . Anemia    PSHx:  Past Surgical History:  Procedure Laterality Date  . NO PAST SURGERIES     Medications:  Medications Prior to Admission  Medication Sig Dispense Refill Last Dose  . ferrous sulfate 325 (65 FE) MG tablet Take 1 tablet (325 mg total) by mouth 3 (three) times daily with meals. (Patient taking differently: Take 325 mg by mouth daily with breakfast. ) 90 tablet 5 11/15/2018 at Unknown time  . Prenat-Fe Poly-Methfol-FA-DHA (VITAFOL ULTRA) 29-0.6-0.4-200 MG CAPS Take 1 tablet by mouth daily before breakfast. 90 capsule 4 11/16/2018 at Unknown time  . docusate sodium (COLACE) 100 MG capsule Take 1 capsule (100 mg total) by mouth 2 (two) times daily. (Patient not taking: Reported on 11/16/2018) 10 capsule 0 Not Taking at Unknown time  . Doxylamine-Pyridoxine (DICLEGIS) 10-10 MG TBEC 1 tab in AM, 1 tab mid afternoon 2 tabs at bedtime. Max dose 4 tabs daily. (Patient not taking: Reported on 11/16/2018) 100 tablet 5 Not Taking at Unknown time  . Elastic Bandages & Supports (COMFORT  FIT MATERNITY SUPP MED) MISC 1 Device by Does not apply route daily. 1 each 0 Taking  . iron polysaccharides (NIFEREX) 150 MG capsule Take 1 capsule (150 mg total) by mouth 2 (two) times daily. (Patient not taking: Reported on 11/16/2018) 60 capsule 2 Not Taking at Unknown time     Allergies: is allergic to peanuts [peanut oil]. OBHx:  OB History  Gravida Para Term Preterm AB Living  3 1 1   1 1   SAB TAB Ectopic Multiple Live Births    1     1    # Outcome Date GA Lbr Len/2nd Weight Sex Delivery Anes PTL Lv  3 Current           2 TAB 2018 [redacted]w[redacted]d         1 Term 11/09/11    M Vag-Spont   LIV         FHx:  Family History  Problem Relation Age of Onset  . Leukemia Father   . Breast cancer Sister    Soc Hx:  Social History   Socioeconomic History  . Marital status: Single    Spouse name: Not on file  . Number of children: Not on file  . Years of education: Not on file  . Highest education level: Not on file  Occupational History  . Not on file  Social Needs  . Financial resource strain: Not on file  . Food insecurity:    Worry: Not on file  Inability: Not on file  . Transportation needs:    Medical: Not on file    Non-medical: Not on file  Tobacco Use  . Smoking status: Former Smoker    Last attempt to quit: 07/20/2018    Years since quitting: 0.3  . Smokeless tobacco: Never Used  . Tobacco comment: occasional (5 in the past week)  Substance and Sexual Activity  . Alcohol use: No  . Drug use: Never  . Sexual activity: Yes  Lifestyle  . Physical activity:    Days per week: Not on file    Minutes per session: Not on file  . Stress: Not on file  Relationships  . Social connections:    Talks on phone: Not on file    Gets together: Not on file    Attends religious service: Not on file    Active member of club or organization: Not on file    Attends meetings of clubs or organizations: Not on file    Relationship status: Not on file  . Intimate partner violence:     Fear of current or ex partner: Not on file    Emotionally abused: Not on file    Physically abused: Not on file    Forced sexual activity: Not on file  Other Topics Concern  . Not on file  Social History Narrative  . Not on file    Objective    Current Vital Signs 24h Vital Sign Ranges  T 98.5 F (36.9 C) Temp  Avg: 98.4 F (36.9 C)  Min: 98.2 F (36.8 C)  Max: 98.5 F (36.9 C)  BP (!) 109/56 BP  Min: 109/56  Max: 131/67  HR (!) 104 Pulse  Avg: 104.6  Min: 91  Max: 129  RR 16 Resp  Avg: 18  Min: 16  Max: 20  SaO2 94 %   SpO2  Avg: 99.1 %  Min: 94 %  Max: 100 %       24 Hour I/O Current Shift I/O  Time Ins Outs No intake/output data recorded. No intake/output data recorded.   EFM: 130 baseline, +accels, no decel, mod variability  Toco: irregular UCs  General: Well nourished, well developed female in no acute distress.  Skin:  Warm and dry.  Cardiovascular: S1, S2 normal, no murmur, rub or gallop, regular rate and rhythm Respiratory:  Clear to auscultation bilateral. Normal respiratory effort Abdomen: gravid, nttp Neuro/Psych:  Normal mood and affect.    Labs  O NEG  Recent Labs  Lab 11/15/18 1113 11/16/18 1304  WBC 6.9 7.7  HGB 7.4* 7.7*  HCT 24.5* 26.9*  PLT 98* 106*    Recent Labs  Lab 11/16/18 1304  NA 135  K 3.9  CL 107  CO2 22  BUN 7  CREATININE 0.64  CALCIUM 8.9  GLUCOSE 105*    Radiology No new imaging   Assessment & Plan   28 y.o. [id number] @ [redacted]w[redacted]d with sympt anemia. Pt stable *Pregnancy: routine care. qday NST. Reactive nst today *Anemia: pt to get two units PRBCs. Pt would like to still until tomorrow morning which is fine since it'll be late before blood is done *Plts: stable.  *Social: pt lives in pregnancy home. F/u in AM if pt needs any SW help.  *FEN/GI: regular diet *PPx: SCDs, bedrest with bathroom privileges. Fall precautions *Dispo: see above.   Cornelia Copa MD Attending Center for [facility])

## 2018-11-16 NOTE — ED Triage Notes (Signed)
Pt reports going to OBGYN yesterday where they did bloodwork. Pt was told to go to cancer center this morning to see a hematologist due to low hemoglobin. Pt [facility] over [facility]. Pt is [redacted] weeks pregnant. Pt reports feeling dizzy and very fatigued. Pt also reports feeling some tightness in her abd since this morning.

## 2018-11-16 NOTE — MAU Note (Signed)
Pt states she was sent from office to [facility] ED for evaluation for low hemoglobin. Was sent here via EMS for evaluation

## 2018-11-16 NOTE — ED Notes (Signed)
[facility] notified of need for transport.  Receiving provider - [facility], Receiving unit- [facility].

## 2018-11-17 ENCOUNTER — Telehealth: Payer: Self-pay | Admitting: Hematology

## 2018-11-17 DIAGNOSIS — O99013 Anemia complicating pregnancy, third trimester: Principal | ICD-10-CM

## 2018-11-17 DIAGNOSIS — O0973 Supervision of high risk pregnancy due to social problems, third trimester: Secondary | ICD-10-CM

## 2018-11-17 LAB — BPAM RBC
Blood Product Expiration Date: 202002142359
Blood Product Expiration Date: 202002172359
ISSUE DATE / TIME: 202002061819
ISSUE DATE / TIME: 202002062158
Unit Type and Rh: 9500
Unit Type and Rh: 9500

## 2018-11-17 LAB — TYPE AND SCREEN
ABO/RH(D): O NEG
Antibody Screen: POSITIVE
Unit division: 0
Unit division: 0

## 2018-11-17 LAB — CBC
HCT: 28.5 % — ABNORMAL LOW (ref 36.0–46.0)
Hemoglobin: 8.7 g/dL — ABNORMAL LOW (ref 12.0–15.0)
MCH: 24.2 pg — AB (ref 26.0–34.0)
MCHC: 30.5 g/dL (ref 30.0–36.0)
MCV: 79.2 fL — ABNORMAL LOW (ref 80.0–100.0)
Platelets: 101 10*3/uL — ABNORMAL LOW (ref 150–400)
RBC: 3.6 MIL/uL — ABNORMAL LOW (ref 3.87–5.11)
RDW: 16.6 % — ABNORMAL HIGH (ref 11.5–15.5)
WBC: 7.9 10*3/uL (ref 4.0–10.5)
nRBC: 0 % (ref 0.0–0.2)

## 2018-11-17 NOTE — Discharge Instructions (Signed)
Blood Transfusion, Adult, Care After This sheet gives you information about how to care for yourself after your procedure. Your doctor may also give you more specific instructions. If you have problems or questions, contact your doctor. Follow these instructions at home:   Take over-the-counter and prescription medicines only as told by your doctor.  Go back to your normal activities as told by your doctor.  Follow instructions from your doctor about how to take care of the area where an IV tube was put into your vein (insertion site). Make sure you: ? Wash your hands with soap and water before you change your bandage (dressing). If there is no soap and water, use hand sanitizer. ? Change your bandage as told by your doctor.  Check your IV insertion site every day for signs of infection. Check for: ? More redness, swelling, or pain. ? More fluid or blood. ? Warmth. ? Pus or a bad smell. Contact a doctor if:  You have more redness, swelling, or pain around the IV insertion site.  You have more fluid or blood coming from the IV insertion site.  Your IV insertion site feels warm to the touch.  You have pus or a bad smell coming from the IV insertion site.  Your pee (urine) turns pink, red, or brown.  You feel weak after doing your normal activities. Get help right away if:  You have signs of a serious allergic or body defense (immune) system reaction, including: ? Itchiness. ? Hives. ? Trouble breathing. ? Anxiety. ? Pain in your chest or lower back. ? Fever, flushing, and chills. ? Fast pulse. ? Rash. ? Watery poop (diarrhea). ? Throwing up (vomiting). ? Dark pee. ? Serious headache. ? Dizziness. ? Stiff neck. ? Yellow color in your face or the white parts of your eyes (jaundice). Summary  After a blood transfusion, return to your normal activities as told by your doctor.  Every day, check for signs of infection where the IV tube was put into your vein.  Some  signs of infection are warm skin, more redness and pain, more fluid or blood, and pus or a bad smell where the needle went in.  Contact your doctor if you feel weak or have any unusual symptoms. This information is not intended to replace advice given to you by your health care provider. Make sure you discuss any questions you have with your health care provider. Document Released: 10/18/2014 Document Revised: 05/21/2016 Document Reviewed: 05/21/2016 Elsevier Interactive Patient Education  2019 [employer]  

## 2018-11-17 NOTE — Progress Notes (Signed)
Pt ambulated out  Teaching  Complete  

## 2018-11-17 NOTE — Discharge Summary (Signed)
Antenatal Physician Discharge Summary  Patient ID: Barbara Cox MRN: [id number] DOB/AGE: [birth year] 25 y.o.  Admit date: 11/16/2018 Discharge date: 11/17/2018  Admission Diagnoses: Active Problems:   Symptomatic anemia   Supervision of high risk pregnancy due to social problems    Discharge Diagnoses: Same  Prenatal Procedures: transfusion 2 u PRBC's.  Consults: None  Hospital Course:  This is a 28 y.o. G3P1011 with IUP at [redacted]w[redacted]d admitted for symptomatic anemia. She was admitted with anemia, and heme/onc asked for blood transfusion. She was initially at [facility], and transferred over to get blood. She received 2 units and was deemed stable for discharge to home with outpatient follow up.  Discharge Exam: Temp:  [97.7 F (36.5 C)-99.1 F (37.3 C)] 98.4 F (36.9 C) (02/07 0815) Pulse Rate:  [79-129] 91 (02/07 0815) Resp:  [16-20] 18 (02/07 0815) BP: (94-131)/(56-78) 107/65 (02/07 0815) SpO2:  [94 %-100 %] 100 % (02/07 0815) Weight:  [87.9 kg-88 kg] 88 kg (02/06 1700) Physical Examination: CONSTITUTIONAL: Well-developed, well-nourished female in no acute distress.  HENT:  Normocephalic, atraumatic, External right and left ear normal.  EYES: Conjunctivae and EOM are normal.No scleral icterus.  NECK: Normal range of motion, supple, no masses SKIN: Skin is warm and dry. No rash noted. NEUROLGIC: Alert and oriented to person, place, and time. PSYCHIATRIC: Normal mood and affect. Normal behavior. Normal judgment and thought content. CARDIOVASCULAR: Normal heart rate noted, regular rhythm RESPIRATORY: Effort and breath sounds normal  ABDOMEN: Soft, nontender, nondistended, gravid.  Fetal monitoring: FHR: 130 bpm, Variability: moderate, Accelerations: Present, Decelerations: Absent  Uterine activity: irritability  Significant Diagnostic Studies:  Results for orders placed or performed during the hospital encounter of 11/16/18 (from the past 168 hour(s))  CBC   Collection Time: 11/16/18   1:04 PM  Result Value Ref Range   WBC 7.7 4.0 - 10.5 K/uL   RBC 3.32 (L) 3.87 - 5.11 MIL/uL   Hemoglobin 7.7 (L) 12.0 - 15.0 g/dL   HCT 66.5 (L) 99.3 - 57.0 %   MCV 81.0 80.0 - 100.0 fL   MCH 23.2 (L) 26.0 - 34.0 pg   MCHC 28.6 (L) 30.0 - 36.0 g/dL   RDW 17.7 (H) 93.9 - 03.0 %   Platelets 106 (L) 150 - 400 K/uL   nRBC 0.0 0.0 - 0.2 %  Basic metabolic panel   Collection Time: 11/16/18  1:04 PM  Result Value Ref Range   Sodium 135 135 - 145 mmol/L   Potassium 3.9 3.5 - 5.1 mmol/L   Chloride 107 98 - 111 mmol/L   CO2 22 22 - 32 mmol/L   Glucose, Bld 105 (H) 70 - 99 mg/dL   BUN 7 6 - 20 mg/dL   Creatinine, Ser 0.92 0.44 - 1.00 mg/dL   Calcium 8.9 8.9 - 33.0 mg/dL   GFR calc non Af Amer >60 >60 mL/min   GFR calc Af Amer >60 >60 mL/min   Anion gap 6 5 - 15  Prepare RBC   Collection Time: 11/16/18  2:56 PM  Result Value Ref Range   Order Confirmation      ORDER PROCESSED BY BLOOD BANK Performed at [facility], [address] [place], [place] [address]   Type and screen [facility] [place]   Collection Time: 11/16/18  3:12 PM  Result Value Ref Range   ABO/RH(D) O NEG    Antibody Screen POS    Sample Expiration 11/19/2018    Antibody Identification PASSIVELY ACQUIRED ANTI-D    Unit  Number [id number]    Blood Component Type RBC LR PHER2    Unit division 00    Status of Unit ISSUED    Transfusion Status OK TO TRANSFUSE    Crossmatch Result COMPATIBLE    Unit Number [id number]    Blood Component Type RED CELLS,LR    Unit division 00    Status of Unit ISSUED    Transfusion Status OK TO TRANSFUSE    Crossmatch Result COMPATIBLE   BPAM RBC   Collection Time: 11/16/18  3:12 PM  Result Value Ref Range   ISSUE DATE / TIME [id number]    Blood Product Unit Number [id number]    PRODUCT CODE Q6761P50    Unit Type and Rh 9500    Blood Product Expiration Date 932671245809    ISSUE DATE / TIME [id number]    Blood Product Unit Number [id number]     PRODUCT CODE E0336V00    Unit Type and Rh 9500    Blood Product Expiration Date 973532992426   CBC   Collection Time: 11/17/18  5:40 AM  Result Value Ref Range   WBC 7.9 4.0 - 10.5 K/uL   RBC 3.60 (L) 3.87 - 5.11 MIL/uL   Hemoglobin 8.7 (L) 12.0 - 15.0 g/dL   HCT 83.4 (L) 19.6 - 22.2 %   MCV 79.2 (L) 80.0 - 100.0 fL   MCH 24.2 (L) 26.0 - 34.0 pg   MCHC 30.5 30.0 - 36.0 g/dL   RDW 97.9 (H) 89.2 - 11.9 %   Platelets 101 (L) 150 - 400 K/uL   nRBC 0.0 0.0 - 0.2 %  Results for orders placed or performed in visit on 11/15/18 (from the past 168 hour(s))  CBC   Collection Time: 11/15/18 11:13 AM  Result Value Ref Range   WBC 6.9 3.4 - 10.8 x10E3/uL   RBC 3.18 (L) 3.77 - 5.28 x10E6/uL   Hemoglobin 7.4 (L) 11.1 - 15.9 g/dL   Hematocrit 41.7 (L) 40.8 - 46.6 %   MCV 77 (L) 79 - 97 fL   MCH 23.3 (L) 26.6 - 33.0 pg   MCHC 30.2 (L) 31.5 - 35.7 g/dL   RDW 14.4 (H) 81.8 - 56.3 %   Platelets 98 (LL) 150 - 450 x10E3/uL   Hematology Comments: Note:      Future Appointments  Date Time Provider Department Center  11/22/2018 10:30 AM Brock Bad, MD [facility] None  01/15/2019  8:45 AM [facility] LAB ONLY [facility] None  01/15/2019  9:20 AM Johney Maine, MD [facility] None  01/15/2019 10:15 AM [facility] INFUSION [facility] None    Discharge Condition: Stable  Discharge disposition: 01-Home or Self Care       Discharge Instructions    Discharge activity:  No Restrictions   Complete by:  As directed    Discharge diet:  No restrictions   Complete by:  As directed    Fetal Kick Count:  Lie on our left side for one hour after a meal, and count the number of times your baby kicks.  If it is less than 5 times, get up, move around and drink some juice.  Repeat the test 30 minutes later.  If it is still less than 5 kicks in an hour, notify your doctor.   Complete by:  As directed    LABOR:  When conractions begin, you should start to time them from the beginning of one contraction to  the beginning  of the next.  When contractions are 5 - 10  minutes apart or less and have been regular for at least an hour, you should call your health care provider.   Complete by:  As directed    No sexual activity restrictions   Complete by:  As directed    Notify physician for bleeding from the vagina   Complete by:  As directed    Notify physician for blurring of vision or spots before the eyes   Complete by:  As directed    Notify physician for chills or fever   Complete by:  As directed    Notify physician for fainting spells, "black outs" or loss of consciousness   Complete by:  As directed    Notify physician for increase in vaginal discharge   Complete by:  As directed    Notify physician for leaking of fluid   Complete by:  As directed    Notify physician for pain or burning when urinating   Complete by:  As directed    Notify physician for pelvic pressure (sudden increase)   Complete by:  As directed    Notify physician for severe or continued nausea or vomiting   Complete by:  As directed    Notify physician for sudden gushing of fluid from the vagina (with or without continued leaking)   Complete by:  As directed    Notify physician for sudden, constant, or occasional abdominal pain   Complete by:  As directed    Notify physician if baby moving less than usual   Complete by:  As directed      Allergies as of 11/17/2018      Reactions   Peanuts [peanut Oil]    Anaphylaxis       Medication List    TAKE these medications   COMFORT FIT MATERNITY SUPP MED Misc 1 Device by Does not apply route daily.   docusate sodium 100 MG capsule Commonly known as:  COLACE Take 1 capsule (100 mg total) by mouth 2 (two) times daily.   Doxylamine-Pyridoxine 10-10 MG Tbec Commonly known as:  DICLEGIS 1 tab in AM, 1 tab mid afternoon 2 tabs at bedtime. Max dose 4 tabs daily.   ferrous sulfate 325 (65 FE) MG tablet Take 1 tablet (325 mg total) by mouth 3 (three) times daily with  meals. What changed:  when to take this   iron polysaccharides 150 MG capsule Commonly known as:  NIFEREX Take 1 capsule (150 mg total) by mouth 2 (two) times daily.   VITAFOL ULTRA 29-0.6-0.4-200 MG Caps Take 1 tablet by mouth daily before breakfast.      Follow-up Information    [facility].   Specialty:  Obstetrics and Gynecology Why:  keep next scheduled appointment Contact information: [address], [address] [place] [phone]          Signed: Reva Boresanya S Jaelen Soth M.D. 11/17/2018, 8:49 AM

## 2018-11-17 NOTE — Plan of Care (Signed)

## 2018-11-17 NOTE — Telephone Encounter (Signed)
Scheduled appt  per 2/06 los.  Called patient is patient is aware of appt date and time.

## 2018-11-22 ENCOUNTER — Ambulatory Visit (INDEPENDENT_AMBULATORY_CARE_PROVIDER_SITE_OTHER): Payer: Medicaid Other | Admitting: Obstetrics

## 2018-11-22 ENCOUNTER — Encounter: Payer: Self-pay | Admitting: Obstetrics

## 2018-11-22 ENCOUNTER — Other Ambulatory Visit (HOSPITAL_COMMUNITY)
Admission: RE | Admit: 2018-11-22 | Discharge: 2018-11-22 | Disposition: A | Discharge status: Home / Self Care | Payer: Medicaid Other | Source: Ambulatory Visit | Attending: Obstetrics | Admitting: Obstetrics

## 2018-11-22 VITALS — BP 118/72 | HR 105 | Wt 199.0 lb

## 2018-11-22 DIAGNOSIS — D696 Thrombocytopenia, unspecified: Secondary | ICD-10-CM

## 2018-11-22 DIAGNOSIS — Z3A36 36 weeks gestation of pregnancy: Secondary | ICD-10-CM

## 2018-11-22 DIAGNOSIS — O0990 Supervision of high risk pregnancy, unspecified, unspecified trimester: Secondary | ICD-10-CM

## 2018-11-22 DIAGNOSIS — O99013 Anemia complicating pregnancy, third trimester: Secondary | ICD-10-CM

## 2018-11-22 DIAGNOSIS — O99119 Other diseases of the blood and blood-forming organs and certain disorders involving the immune mechanism complicating pregnancy, unspecified trimester: Secondary | ICD-10-CM

## 2018-11-22 DIAGNOSIS — O0993 Supervision of high risk pregnancy, unspecified, third trimester: Secondary | ICD-10-CM

## 2018-11-22 NOTE — Progress Notes (Signed)
Subjective:  Barbara Cox is a 28 y.o. G3P1011 at [redacted]w[redacted]d being seen today for ongoing prenatal care.  She is currently monitored for the following issues for this high-risk pregnancy and has Supervision of other normal pregnancy, antepartum; Rh negative status during pregnancy; Thrombocytopenia affecting pregnancy (HCC); Symptomatic anemia; and Supervision of high risk pregnancy due to social problems on their problem list.  Pt states she had blood transfusion last week.   Patient reports backache and occasional contractions.  Contractions: Irregular. Vag. Bleeding: None.  Movement: Present. Denies leaking of fluid.   The following portions of the patient's history were reviewed and updated as appropriate: allergies, current medications, past family history, past medical history, past social history, past surgical history and problem list. Problem list updated.  Objective:   Vitals:   11/22/18 1038  BP: 118/72  Pulse: (!) 105  Weight: 199 lb (90.3 kg)    Fetal Status: Fetal Heart Rate (bpm): 160   Movement: Present     General:  Alert, oriented and cooperative. Patient is in no acute distress.  Skin: Skin is warm and dry. No rash noted.   Cardiovascular: Normal heart rate noted  Respiratory: Normal respiratory effort, no problems with respiration noted  Abdomen: Soft, gravid, appropriate for gestational age. Pain/Pressure: Present     Pelvic:  Cervical exam performed      Cvx:  Long / closed / -3 / Vtx  Extremities: Normal range of motion.  Edema: None  Mental Status: Normal mood and affect. Normal behavior. Normal judgment and thought content.   Urinalysis:      Assessment and Plan:  Pregnancy: G3P1011 at [redacted]w[redacted]d  1. Supervision of high risk pregnancy, antepartum Rx: - Strep Gp B NAA - GC/Chlamydia probe amp (Stamps)not at [facility]  2. Anemia affecting pregnancy in third trimester - received 2 units PRBC's a week ago.  Post transfusion Hgb = 8.7  3. Thrombocytopenia affecting  pregnancy, antepartum (HCC) - plts = 101,000  Preterm labor symptoms and general obstetric precautions including but not limited to vaginal bleeding, contractions, leaking of fluid and fetal movement were reviewed in detail with the patient. Please refer to After Visit Summary for other counseling recommendations.  Return in about 1 week (around 11/29/2018) for ROB.   Brock Bad, MDPt notes vaginal spotting last night denies any recent intercourse.

## 2018-11-23 ENCOUNTER — Other Ambulatory Visit: Payer: Self-pay | Admitting: Obstetrics

## 2018-11-23 DIAGNOSIS — D696 Thrombocytopenia, unspecified: Secondary | ICD-10-CM

## 2018-11-23 DIAGNOSIS — O99013 Anemia complicating pregnancy, third trimester: Secondary | ICD-10-CM

## 2018-11-23 DIAGNOSIS — O99119 Other diseases of the blood and blood-forming organs and certain disorders involving the immune mechanism complicating pregnancy, unspecified trimester: Principal | ICD-10-CM

## 2018-11-23 LAB — GC/CHLAMYDIA PROBE AMP (~~LOC~~) NOT AT ARMC
Chlamydia: NEGATIVE
NEISSERIA GONORRHEA: NEGATIVE

## 2018-11-23 LAB — CBC
Hematocrit: 28 % — ABNORMAL LOW (ref 34.0–46.6)
Hemoglobin: 9.1 g/dL — ABNORMAL LOW (ref 11.1–15.9)
MCH: 24.9 pg — ABNORMAL LOW (ref 26.6–33.0)
MCHC: 32.5 g/dL (ref 31.5–35.7)
MCV: 77 fL — ABNORMAL LOW (ref 79–97)
Platelets: 91 10*3/uL — CL (ref 150–450)
RBC: 3.65 x10E6/uL — ABNORMAL LOW (ref 3.77–5.28)
RDW: 16.8 % — ABNORMAL HIGH (ref 11.7–15.4)
WBC: 7.2 10*3/uL (ref 3.4–10.8)

## 2018-11-24 LAB — STREP GP B NAA: Strep Gp B NAA: NEGATIVE

## 2018-11-27 ENCOUNTER — Encounter: Payer: Self-pay | Admitting: Obstetrics

## 2018-11-27 ENCOUNTER — Ambulatory Visit (INDEPENDENT_AMBULATORY_CARE_PROVIDER_SITE_OTHER): Payer: Medicaid Other | Admitting: Obstetrics

## 2018-11-27 VITALS — BP 112/69 | HR 97 | Wt 197.0 lb

## 2018-11-27 DIAGNOSIS — O0990 Supervision of high risk pregnancy, unspecified, unspecified trimester: Secondary | ICD-10-CM

## 2018-11-27 DIAGNOSIS — O0993 Supervision of high risk pregnancy, unspecified, third trimester: Secondary | ICD-10-CM

## 2018-11-27 DIAGNOSIS — D696 Thrombocytopenia, unspecified: Secondary | ICD-10-CM

## 2018-11-27 DIAGNOSIS — O99119 Other diseases of the blood and blood-forming organs and certain disorders involving the immune mechanism complicating pregnancy, unspecified trimester: Secondary | ICD-10-CM

## 2018-11-27 DIAGNOSIS — Z3A36 36 weeks gestation of pregnancy: Secondary | ICD-10-CM

## 2018-11-27 DIAGNOSIS — O99013 Anemia complicating pregnancy, third trimester: Secondary | ICD-10-CM

## 2018-11-27 NOTE — Progress Notes (Addendum)
Subjective:  Barbara Cox is a 28 y.o. G3P1011 at [redacted]w[redacted]d being seen today for ongoing prenatal care.  She is currently monitored for the following issues for this high-risk pregnancy and has Supervision of other normal pregnancy, antepartum; Rh negative status during pregnancy; Thrombocytopenia affecting pregnancy (HCC); Symptomatic anemia; and Supervision of high risk pregnancy due to social problems on their problem list.  Patient reports no complaints.  Contractions: Irritability. Vag. Bleeding: None.  Movement: Present. Denies leaking of fluid.   The following portions of the patient's history were reviewed and updated as appropriate: allergies, current medications, past family history, past medical history, past social history, past surgical history and problem list. Problem list updated.  Objective:   Vitals:   11/27/18 1415  BP: 112/69  Pulse: 97  Weight: 197 lb (89.4 kg)    Fetal Status:     Movement: Present     General:  Alert, oriented and cooperative. Patient is in no acute distress.  Skin: Skin is warm and dry. No rash noted.   Cardiovascular: Normal heart rate noted  Respiratory: Normal respiratory effort, no problems with respiration noted  Abdomen: Soft, gravid, appropriate for gestational age. Pain/Pressure: Present     Pelvic:  Cervical exam deferred        Extremities: Normal range of motion.  Edema: Trace  Mental Status: Normal mood and affect. Normal behavior. Normal judgment and thought content.   Urinalysis:      Assessment and Plan:  Pregnancy: G3P1011 at [redacted]w[redacted]d  1. Supervision of high risk pregnancy, antepartum  2. Thrombocytopenia affecting pregnancy (HCC) - MFM Consult scheduled for tomorrow  3. Anemia affecting pregnancy in third trimester - received 2 units PRBC's last week, and Hgb increased from 7 grams to 9 grams post-transfusion  Term labor symptoms and general obstetric precautions including but not limited to vaginal bleeding, contractions,  leaking of fluid and fetal movement were reviewed in detail with the patient. Please refer to After Visit Summary for other counseling recommendations.  Return in about 1 week (around 12/04/2018) for ROB.   Brock Bad, MD  11-27-2018    11-30-2018  MFM CONSULTATION NOTE   1  Barbara Cox Female, 28 y.o., 07-01-1991 MRN:  [id number] Phone:  [phone] Judie Petit) PCP:  Patient, No Pcp Per Primary Cvg:  MEDICAID [place]MEDICAID [place] Next Appt With me 12/04/2018 at 3:30 PM Inpatient Notes  Received: 2 days ago  Message Contents  Noralee Space, MD  Brock Bad, MD      Attached Notes   Consult Note by Noralee Space, MD at 11/28/2018 10:30 AM   Author: Noralee Space, MD Service: Maternal-Fetal Medicine Author Type: Physician  Filed: 11/28/2018 5:43 PM Date of Service: 11/28/2018 10:30 AM Note Type: Consult Note  Status: Signed Editor: Noralee Space, MD (Physician)    Maternal-Fetal Medicine Name: Barbara Cox MRN: [id number] Requesting Provider: Coral Ceo, MD  I had the [facility] of [facility] at [facility]. She is a G3 P1011 at 36w 6d gestation and is here for consultation because of thrombocytopenia. Her platelet count last week was 91K. She does not have bruising or bleeding. Her blood pressures have been normal so far at prenatal visits.  In her early pregnancy, her platelet counts were within normal limits (177 K).  Patient has anemia and had 2 units PRBC transfusion 2 weeks ago. She still feels "drained" and tired. No dizziness or blackouts. Recent hemoglobin (02/12) after transfusion was 9.1 g/dL. Iron studies  confirmed iron-deficient anemia. Patient reports she is scheduled for iron transfusion after delivery (April).  She does not have hypertension or diabetes. She does not have sickle-cell trait.  PSH: Nil of note. Medications: Prenatal vitamins, iron supplements. Allergies: Peanuts ("anaphylactic shock") Social:  Denies tobacco or drug or alcohol use. Her partner is [place] [employer] architect and he is in good health. Family: No history of venous thromboembolism in the family. Labs (11/22/18): Hb 9.1, Hct 28, PLT 91, WBC 7.2, GBS negative. Fetal growth assessment performed on 11/13/18 showed fetal growth to be appropriate for gestational age.  I counseled the patient on the following: Gestational thrombocytopenia: Based on the history and platelet counts she has gestational thrombocytopenia.She does not have hypertension or preeclampsia. Her platelet counts were never too low to the diagnosis of immune thrombocytopenia.   Thrombocytopenia accounts for about 80% of thrombocytopenia is in pregnancies.The lower limit of platelet count below which thrombocytopenia as is defined is notestablished andrangesbetween 115 to 123K.  I reassured the patient that gestational thrombocytopenia is not associated with any adversematernal or fetal outcomes.Regional anesthesia can be safely administered. Likelihood of epidural hematoma is very low even if the platelet counts are below 90 K. Most anesthesiologistswould administer regional anesthesia of the platelet counts are above 70 K.  I also reassured her that the likelihood of neonatal thrombocytopenia born to women with gestational thrombocytopenia is very low (0.2-2%).Gestational thrombocytopenia is more frequent of subsequent pregnancies.  Dexamethasone or prednisone treatment has been tried to improve platelet counts(if they fall below 70K) tofacilitate regional anesthesia but that efficacy is not well proven.   I recommend checking platelet counts at 6-week postpartum visit.  Anemia in pregnancy: Blood should be available at short notice at delivery. Patient does not give history of postpartum hemorrhage in previous pregnancy. I reassured the patient that blood transfusion is not mandatory after delivery and will be decided on hemoglobin levels and  her symptoms.  Intravenous iron infusion can be avoided if the patient is compliant (and tolerates) oral iron.  Thank you for your consult. Please do not hesitate to contact me if you have any questions or concerns.  Consultation including face-to-face counseling: 40 min.

## 2018-11-28 ENCOUNTER — Encounter (HOSPITAL_COMMUNITY): Payer: Self-pay

## 2018-11-28 ENCOUNTER — Ambulatory Visit (HOSPITAL_COMMUNITY)
Admission: RE | Admit: 2018-11-28 | Discharge: 2018-11-28 | Disposition: A | Discharge status: Home / Self Care | Payer: Medicaid Other | Source: Ambulatory Visit | Attending: Obstetrics | Admitting: Obstetrics

## 2018-11-28 DIAGNOSIS — Z3480 Encounter for supervision of other normal pregnancy, unspecified trimester: Secondary | ICD-10-CM

## 2018-11-28 DIAGNOSIS — D509 Iron deficiency anemia, unspecified: Secondary | ICD-10-CM | POA: Diagnosis not present

## 2018-11-28 DIAGNOSIS — Z3A36 36 weeks gestation of pregnancy: Secondary | ICD-10-CM

## 2018-11-28 DIAGNOSIS — O99013 Anemia complicating pregnancy, third trimester: Secondary | ICD-10-CM | POA: Diagnosis not present

## 2018-11-28 DIAGNOSIS — D696 Thrombocytopenia, unspecified: Secondary | ICD-10-CM | POA: Diagnosis not present

## 2018-11-28 DIAGNOSIS — O99113 Other diseases of the blood and blood-forming organs and certain disorders involving the immune mechanism complicating pregnancy, third trimester: Secondary | ICD-10-CM | POA: Diagnosis not present

## 2018-11-28 DIAGNOSIS — D6959 Other secondary thrombocytopenia: Secondary | ICD-10-CM | POA: Diagnosis not present

## 2018-11-28 NOTE — Consult Note (Signed)
Maternal-Fetal Medicine Name: Barbara Cox MRN: [id number] Requesting Provider: Coral Ceo, MD  I had the pleasure of seeing [facility] at the [facility]. She is a G3 P1011 at 36w 6d gestation and is here for consultation because of thrombocytopenia. Her platelet count last week was 91K. She does not have bruising or bleeding. Her blood pressures have been normal so far at prenatal visits.  In her early pregnancy, her platelet counts were within normal limits (177 K).  Patient has anemia and had 2 units PRBC transfusion 2 weeks ago. She still feels "drained" and tired. No dizziness or blackouts. Recent hemoglobin (02/12) after transfusion was 9.1 g/dL. Iron studies confirmed iron-deficient anemia. Patient reports she is scheduled for iron transfusion after delivery (April).  She does not have hypertension or diabetes. She does not have sickle-cell trait.  PSH: Nil of note. Medications: Prenatal vitamins, iron supplements. Allergies: Peanuts ("anaphylactic shock") Social: Denies tobacco or drug or alcohol use. Her partner is [place] [employer] architect and he is in good health. Family: No history of venous thromboembolism in the family. Labs (11/22/18): Hb 9.1, Hct 28, PLT 91, WBC 7.2, GBS negative. Fetal growth assessment performed on 11/13/18 showed fetal growth to be appropriate for gestational age.  I counseled the patient on the following: Gestational thrombocytopenia: Based on the history and platelet counts she has gestational thrombocytopenia.  She does not have hypertension or preeclampsia.  Her platelet counts were never too low to the diagnosis of immune thrombocytopenia.    Thrombocytopenia accounts for about 80% of thrombocytopenia is in pregnancies.  The lower limit of platelet count below which thrombocytopenia as is defined is not established and ranges between 115 to 123K.   I reassured the patient that gestational thrombocytopenia is not associated with  any adverse maternal or fetal outcomes.  Regional anesthesia can be safely administered.  Likelihood of epidural hematoma is very low even if the platelet counts are below 90 K.  Most anesthesiologists would administer regional anesthesia of the platelet counts are above 70 K.   I also reassured her that the likelihood of neonatal thrombocytopenia born to women with gestational thrombocytopenia is very low (0.2-2%).  Gestational thrombocytopenia is more frequent of subsequent pregnancies.  Dexamethasone or prednisone treatment has been tried to improve platelet counts (if they fall below 70K) to facilitate regional anesthesia but that efficacy is not well proven.    I recommend checking platelet counts at 6-week postpartum visit.  Anemia in pregnancy: Blood should be available at short notice at delivery. Patient does not give history of postpartum hemorrhage in previous pregnancy. I reassured the patient that blood transfusion is not mandatory after delivery and will be decided on hemoglobin levels and her symptoms.  Intravenous iron infusion can be avoided if the patient is compliant (and tolerates) oral iron.  Thank you for your consult. Please do not hesitate to contact me if you have any questions or concerns.  Consultation including face-to-face counseling: 40 min.

## 2018-11-29 ENCOUNTER — Encounter: Payer: Medicaid Other | Admitting: Obstetrics

## 2018-12-04 ENCOUNTER — Ambulatory Visit (INDEPENDENT_AMBULATORY_CARE_PROVIDER_SITE_OTHER): Payer: Medicaid Other | Admitting: Obstetrics

## 2018-12-04 ENCOUNTER — Encounter: Payer: Self-pay | Admitting: Obstetrics

## 2018-12-04 DIAGNOSIS — Z3480 Encounter for supervision of other normal pregnancy, unspecified trimester: Secondary | ICD-10-CM

## 2018-12-04 DIAGNOSIS — Z3A37 37 weeks gestation of pregnancy: Secondary | ICD-10-CM

## 2018-12-04 DIAGNOSIS — Z3483 Encounter for supervision of other normal pregnancy, third trimester: Secondary | ICD-10-CM

## 2018-12-04 NOTE — Progress Notes (Signed)
Patient reports fetal movement with a lot of pressure, denies bleeding. Pt reports that she fell out of her bed last night on her back because it is hard for her to move and she rolled out.

## 2018-12-04 NOTE — Progress Notes (Signed)
Subjective:  Barbara Cox is a 28 y.o. G3P1011 at [redacted]w[redacted]d being seen today for ongoing prenatal care.  She is currently monitored for the following issues for this high-risk pregnancy and has Supervision of other normal pregnancy, antepartum; Rh negative status during pregnancy; Thrombocytopenia affecting pregnancy (HCC); Symptomatic anemia; and Supervision of high risk pregnancy due to social problems on their problem list.  Patient reports no complaints.  Contractions: Irritability. Vag. Bleeding: None.  Movement: Present. Denies leaking of fluid.   The following portions of the patient's history were reviewed and updated as appropriate: allergies, current medications, past family history, past medical history, past social history, past surgical history and problem list. Problem list updated.  Objective:   Vitals:   12/04/18 1538  BP: 122/76  Pulse: 88  Weight: 199 lb 4.8 oz (90.4 kg)    Fetal Status: Fetal Heart Rate (bpm): 150   Movement: Present     General:  Alert, oriented and cooperative. Patient is in no acute distress.  Skin: Skin is warm and dry. No rash noted.   Cardiovascular: Normal heart rate noted  Respiratory: Normal respiratory effort, no problems with respiration noted  Abdomen: Soft, gravid, appropriate for gestational age. Pain/Pressure: Present     Pelvic:  Cervical exam deferred        Extremities: Normal range of motion.  Edema: Trace  Mental Status: Normal mood and affect. Normal behavior. Normal judgment and thought content.   Urinalysis:      Assessment and Plan:  Pregnancy: G3P1011 at [redacted]w[redacted]d  1. Supervision of other normal pregnancy, antepartum   Term labor symptoms and general obstetric precautions including but not limited to vaginal bleeding, contractions, leaking of fluid and fetal movement were reviewed in detail with the patient. Please refer to After Visit Summary for other counseling recommendations.  Return in about 1 week (around 12/11/2018) for  ROB.   Brock Bad, MD

## 2018-12-05 ENCOUNTER — Telehealth (HOSPITAL_COMMUNITY): Payer: Self-pay | Admitting: *Deleted

## 2018-12-05 ENCOUNTER — Encounter (HOSPITAL_COMMUNITY): Payer: Self-pay | Admitting: *Deleted

## 2018-12-05 NOTE — Telephone Encounter (Signed)
Preadmission screen  

## 2018-12-07 ENCOUNTER — Other Ambulatory Visit (HOSPITAL_COMMUNITY): Payer: Self-pay | Admitting: Advanced Practice Midwife

## 2018-12-08 ENCOUNTER — Other Ambulatory Visit: Payer: Self-pay | Admitting: Advanced Practice Midwife

## 2018-12-11 ENCOUNTER — Other Ambulatory Visit: Payer: Self-pay

## 2018-12-11 ENCOUNTER — Ambulatory Visit (INDEPENDENT_AMBULATORY_CARE_PROVIDER_SITE_OTHER): Payer: Medicaid Other | Admitting: Obstetrics

## 2018-12-11 ENCOUNTER — Encounter: Payer: Self-pay | Admitting: Obstetrics

## 2018-12-11 VITALS — BP 131/77 | HR 114 | Wt 204.9 lb

## 2018-12-11 DIAGNOSIS — O0990 Supervision of high risk pregnancy, unspecified, unspecified trimester: Secondary | ICD-10-CM

## 2018-12-11 DIAGNOSIS — O99013 Anemia complicating pregnancy, third trimester: Secondary | ICD-10-CM

## 2018-12-11 DIAGNOSIS — O99119 Other diseases of the blood and blood-forming organs and certain disorders involving the immune mechanism complicating pregnancy, unspecified trimester: Secondary | ICD-10-CM

## 2018-12-11 DIAGNOSIS — D696 Thrombocytopenia, unspecified: Secondary | ICD-10-CM

## 2018-12-11 NOTE — Progress Notes (Signed)
ROB.  C/o contractions 14 minutes apart.

## 2018-12-11 NOTE — Progress Notes (Signed)
Subjective:  Barbara Cox is a 28 y.o. G3P1011 at [redacted]w[redacted]d being seen today for ongoing prenatal care.  She is currently monitored for the following issues for this high-risk pregnancy and has Supervision of other normal pregnancy, antepartum; Rh negative status during pregnancy; Thrombocytopenia affecting pregnancy (HCC); Symptomatic anemia; and Supervision of high risk pregnancy due to social problems on their problem list.  Patient reports no complaints.  Contractions: Irregular. Vag. Bleeding: None.  Movement: Present. Denies leaking of fluid.   The following portions of the patient's history were reviewed and updated as appropriate: allergies, current medications, past family history, past medical history, past social history, past surgical history and problem list. Problem list updated.  Objective:   Vitals:   12/11/18 1542  BP: 131/77  Pulse: (!) 114  Weight: 204 lb 14.4 oz (92.9 kg)    Fetal Status:     Movement: Present     General:  Alert, oriented and cooperative. Patient is in no acute distress.  Skin: Skin is warm and dry. No rash noted.   Cardiovascular: Normal heart rate noted  Respiratory: Normal respiratory effort, no problems with respiration noted  Abdomen: Soft, gravid, appropriate for gestational age. Pain/Pressure: Present     Pelvic:  Cvx: 2 cm / 50% / -3 / Vtx  Extremities: Normal range of motion.  Edema: None  Mental Status: Normal mood and affect. Normal behavior. Normal judgment and thought content.   Urinalysis:      Assessment and Plan:  Pregnancy: G3P1011 at [redacted]w[redacted]d  1. Supervision of high risk pregnancy, antepartum  2. Thrombocytopenia affecting pregnancy (HCC)  3. Anemia affecting pregnancy in third trimester  Term labor symptoms and general obstetric precautions including but not limited to vaginal bleeding, contractions, leaking of fluid and fetal movement were reviewed in detail with the patient. Please refer to After Visit Summary for other  counseling recommendations.  Return in about 4 weeks (around 01/08/2019) for Postpartum Care.   Brock Bad, MD

## 2018-12-12 ENCOUNTER — Other Ambulatory Visit: Payer: Self-pay | Admitting: Obstetrics

## 2018-12-12 ENCOUNTER — Other Ambulatory Visit (HOSPITAL_COMMUNITY): Payer: Self-pay | Admitting: *Deleted

## 2018-12-12 ENCOUNTER — Other Ambulatory Visit: Payer: Medicaid Other

## 2018-12-12 ENCOUNTER — Telehealth: Payer: Self-pay

## 2018-12-12 ENCOUNTER — Other Ambulatory Visit: Payer: Self-pay | Admitting: Advanced Practice Midwife

## 2018-12-12 DIAGNOSIS — O99119 Other diseases of the blood and blood-forming organs and certain disorders involving the immune mechanism complicating pregnancy, unspecified trimester: Principal | ICD-10-CM

## 2018-12-12 DIAGNOSIS — D696 Thrombocytopenia, unspecified: Secondary | ICD-10-CM

## 2018-12-12 LAB — CBC
Hematocrit: 28.9 % — ABNORMAL LOW (ref 34.0–46.6)
Hemoglobin: 9.1 g/dL — ABNORMAL LOW (ref 11.1–15.9)
MCH: 23.8 pg — ABNORMAL LOW (ref 26.6–33.0)
MCHC: 31.5 g/dL (ref 31.5–35.7)
MCV: 76 fL — ABNORMAL LOW (ref 79–97)
Platelets: 91 10*3/uL — CL (ref 150–450)
RBC: 3.82 x10E6/uL (ref 3.77–5.28)
RDW: 20.1 % — ABNORMAL HIGH (ref 11.7–15.4)
WBC: 7.6 10*3/uL (ref 3.4–10.8)

## 2018-12-12 NOTE — Telephone Encounter (Signed)
Spoke with pt and advised of urgent need to come in today for lab appt. Pt stated that she is currently in the process of moving and cannot come until 3, notified provider.

## 2018-12-13 ENCOUNTER — Inpatient Hospital Stay (HOSPITAL_COMMUNITY)
Admission: RE | Admit: 2018-12-13 | Discharge: 2018-12-14 | DRG: 807 | Disposition: A | Discharge status: Home / Self Care | Payer: Medicaid Other | Attending: Obstetrics and Gynecology | Admitting: Obstetrics and Gynecology

## 2018-12-13 ENCOUNTER — Encounter (HOSPITAL_COMMUNITY): Payer: Self-pay

## 2018-12-13 ENCOUNTER — Inpatient Hospital Stay (HOSPITAL_COMMUNITY): Payer: Medicaid Other | Admitting: Anesthesiology

## 2018-12-13 ENCOUNTER — Inpatient Hospital Stay (HOSPITAL_COMMUNITY): Payer: Medicaid Other

## 2018-12-13 ENCOUNTER — Other Ambulatory Visit: Payer: Self-pay

## 2018-12-13 DIAGNOSIS — D696 Thrombocytopenia, unspecified: Secondary | ICD-10-CM | POA: Diagnosis present

## 2018-12-13 DIAGNOSIS — O99119 Other diseases of the blood and blood-forming organs and certain disorders involving the immune mechanism complicating pregnancy, unspecified trimester: Secondary | ICD-10-CM

## 2018-12-13 DIAGNOSIS — O9902 Anemia complicating childbirth: Secondary | ICD-10-CM | POA: Diagnosis present

## 2018-12-13 DIAGNOSIS — Z6791 Unspecified blood type, Rh negative: Secondary | ICD-10-CM

## 2018-12-13 DIAGNOSIS — D649 Anemia, unspecified: Secondary | ICD-10-CM | POA: Diagnosis present

## 2018-12-13 DIAGNOSIS — O26893 Other specified pregnancy related conditions, third trimester: Secondary | ICD-10-CM | POA: Diagnosis present

## 2018-12-13 DIAGNOSIS — Z87891 Personal history of nicotine dependence: Secondary | ICD-10-CM | POA: Diagnosis not present

## 2018-12-13 DIAGNOSIS — D6959 Other secondary thrombocytopenia: Secondary | ICD-10-CM | POA: Diagnosis present

## 2018-12-13 DIAGNOSIS — Z3A39 39 weeks gestation of pregnancy: Secondary | ICD-10-CM

## 2018-12-13 DIAGNOSIS — O9912 Other diseases of the blood and blood-forming organs and certain disorders involving the immune mechanism complicating childbirth: Secondary | ICD-10-CM | POA: Diagnosis not present

## 2018-12-13 LAB — COMPREHENSIVE METABOLIC PANEL
ALT: 14 U/L (ref 0–44)
AST: 22 U/L (ref 15–41)
Albumin: 2.5 g/dL — ABNORMAL LOW (ref 3.5–5.0)
Alkaline Phosphatase: 108 U/L (ref 38–126)
Anion gap: 7 (ref 5–15)
BUN: 5 mg/dL — ABNORMAL LOW (ref 6–20)
CHLORIDE: 105 mmol/L (ref 98–111)
CO2: 22 mmol/L (ref 22–32)
Calcium: 8.8 mg/dL — ABNORMAL LOW (ref 8.9–10.3)
Creatinine, Ser: 0.72 mg/dL (ref 0.44–1.00)
GFR calc Af Amer: >60 mL/min (ref >60)
Glucose, Bld: 99 mg/dL (ref 70–99)
POTASSIUM: 3.6 mmol/L (ref 3.5–5.1)
Sodium: 134 mmol/L — ABNORMAL LOW (ref 135–145)
Total Bilirubin: 0.6 mg/dL (ref 0.3–1.2)
Total Protein: 6.2 g/dL — ABNORMAL LOW (ref 6.5–8.1)

## 2018-12-13 LAB — CBC
HCT: 30.6 % — ABNORMAL LOW (ref 36.0–46.0)
HCT: 32.7 % — ABNORMAL LOW (ref 36.0–46.0)
Hemoglobin: 9.2 g/dL — ABNORMAL LOW (ref 12.0–15.0)
Hemoglobin: 9.4 g/dL — ABNORMAL LOW (ref 12.0–15.0)
MCH: 24 pg — ABNORMAL LOW (ref 26.0–34.0)
MCH: 22.8 pg — ABNORMAL LOW (ref 26.0–34.0)
MCHC: 30.1 g/dL (ref 30.0–36.0)
MCHC: 28.7 g/dL — ABNORMAL LOW (ref 30.0–36.0)
MCV: 79.9 fL — ABNORMAL LOW (ref 80.0–100.0)
MCV: 79.2 fL — AB (ref 80.0–100.0)
Platelets: 107 10*3/uL — ABNORMAL LOW (ref 150–400)
Platelets: 95 10*3/uL — ABNORMAL LOW (ref 150–400)
RBC: 3.83 MIL/uL — ABNORMAL LOW (ref 3.87–5.11)
RBC: 4.13 MIL/uL (ref 3.87–5.11)
RDW: 18.8 % — ABNORMAL HIGH (ref 11.5–15.5)
RDW: 18.7 % — ABNORMAL HIGH (ref 11.5–15.5)
WBC: 7.3 10*3/uL (ref 4.0–10.5)
WBC: 8.5 10*3/uL (ref 4.0–10.5)
nRBC: 0 % (ref 0.0–0.2)
nRBC: 0 % (ref 0.0–0.2)

## 2018-12-13 LAB — RPR: RPR Ser Ql: NONREACTIVE

## 2018-12-13 MED ORDER — DIPHENHYDRAMINE HCL 50 MG/ML IJ SOLN: 12.5000 mg | INTRAMUSCULAR | Status: DC | PRN

## 2018-12-13 MED ORDER — LACTATED RINGERS IV SOLN
INTRAVENOUS | Status: DC
  Administered 2018-12-13 (×3): via INTRAVENOUS

## 2018-12-13 MED ORDER — SOD CITRATE-CITRIC ACID 500-334 MG/5ML PO SOLN: 30.0000 mL | ORAL | Status: DC | PRN

## 2018-12-13 MED ORDER — EPHEDRINE 5 MG/ML INJ: 10.0000 mg | INTRAVENOUS | Status: DC | PRN

## 2018-12-13 MED ORDER — FENTANYL-BUPIVACAINE-NACL 0.5-0.125-0.9 MG/250ML-% EP SOLN
EPIDURAL | Status: AC
  Filled 2018-12-13: qty 250

## 2018-12-13 MED ORDER — ACETAMINOPHEN 325 MG PO TABS: 650.0000 mg | ORAL_TABLET | ORAL | Status: DC | PRN

## 2018-12-13 MED ORDER — SENNOSIDES-DOCUSATE SODIUM 8.6-50 MG PO TABS
2.0000 | ORAL_TABLET | ORAL | Status: DC
  Administered 2018-12-13: 2 via ORAL
  Filled 2018-12-13: qty 2

## 2018-12-13 MED ORDER — WITCH HAZEL-GLYCERIN EX PADS: 1.0000 "application " | MEDICATED_PAD | CUTANEOUS | Status: DC | PRN

## 2018-12-13 MED ORDER — OXYCODONE-ACETAMINOPHEN 5-325 MG PO TABS: 2.0000 | ORAL_TABLET | ORAL | Status: DC | PRN

## 2018-12-13 MED ORDER — PHENYLEPHRINE 40 MCG/ML (10ML) SYRINGE FOR IV PUSH (FOR BLOOD PRESSURE SUPPORT)
PREFILLED_SYRINGE | INTRAVENOUS | Status: AC
  Filled 2018-12-13: qty 10

## 2018-12-13 MED ORDER — ACETAMINOPHEN 325 MG PO TABS
650.0000 mg | ORAL_TABLET | ORAL | Status: DC | PRN
  Administered 2018-12-13 – 2018-12-14 (×2): 650 mg via ORAL
  Filled 2018-12-13 (×2): qty 2

## 2018-12-13 MED ORDER — LACTATED RINGERS IV SOLN
500.0000 mL | Freq: Once | INTRAVENOUS | Status: AC
  Administered 2018-12-13: 500 mL via INTRAVENOUS

## 2018-12-13 MED ORDER — OXYTOCIN 40 UNITS IN NORMAL SALINE INFUSION - SIMPLE MED: 2.5000 [IU]/h | INTRAVENOUS | Status: DC

## 2018-12-13 MED ORDER — COCONUT OIL OIL: 1.0000 "application " | TOPICAL_OIL | Status: DC | PRN

## 2018-12-13 MED ORDER — TETANUS-DIPHTH-ACELL PERTUSSIS 5-2.5-18.5 LF-MCG/0.5 IM SUSP: 0.5000 mL | Freq: Once | INTRAMUSCULAR | Status: DC

## 2018-12-13 MED ORDER — FENTANYL-BUPIVACAINE-NACL 0.5-0.125-0.9 MG/250ML-% EP SOLN: 12.0000 mL/h | EPIDURAL | Status: DC | PRN

## 2018-12-13 MED ORDER — ZOLPIDEM TARTRATE 5 MG PO TABS: 5.0000 mg | ORAL_TABLET | Freq: Every evening | ORAL | Status: DC | PRN

## 2018-12-13 MED ORDER — PHENYLEPHRINE 40 MCG/ML (10ML) SYRINGE FOR IV PUSH (FOR BLOOD PRESSURE SUPPORT): 80.0000 ug | PREFILLED_SYRINGE | INTRAVENOUS | Status: DC | PRN

## 2018-12-13 MED ORDER — LACTATED RINGERS IV SOLN: 500.0000 mL | INTRAVENOUS | Status: DC | PRN

## 2018-12-13 MED ORDER — ONDANSETRON HCL 4 MG/2ML IJ SOLN: 4.0000 mg | Freq: Four times a day (QID) | INTRAMUSCULAR | Status: DC | PRN

## 2018-12-13 MED ORDER — FENTANYL CITRATE (PF) 100 MCG/2ML IJ SOLN: 100.0000 ug | INTRAMUSCULAR | Status: DC | PRN

## 2018-12-13 MED ORDER — OXYTOCIN BOLUS FROM INFUSION
500.0000 mL | Freq: Once | INTRAVENOUS | Status: AC
  Administered 2018-12-13: 500 mL via INTRAVENOUS

## 2018-12-13 MED ORDER — TERBUTALINE SULFATE 1 MG/ML IJ SOLN: 0.2500 mg | Freq: Once | INTRAMUSCULAR | Status: DC | PRN

## 2018-12-13 MED ORDER — LIDOCAINE HCL (PF) 1 % IJ SOLN
INTRAMUSCULAR | Status: DC | PRN
  Administered 2018-12-13 (×2): 4 mL via EPIDURAL

## 2018-12-13 MED ORDER — OXYCODONE-ACETAMINOPHEN 5-325 MG PO TABS: 1.0000 | ORAL_TABLET | ORAL | Status: DC | PRN

## 2018-12-13 MED ORDER — PRENATAL MULTIVITAMIN CH
1.0000 | ORAL_TABLET | Freq: Every day | ORAL | Status: DC
  Administered 2018-12-14: 1 via ORAL
  Filled 2018-12-13: qty 1

## 2018-12-13 MED ORDER — LIDOCAINE HCL (PF) 1 % IJ SOLN: 30.0000 mL | INTRAMUSCULAR | Status: DC | PRN

## 2018-12-13 MED ORDER — DIBUCAINE 1 % RE OINT: 1.0000 "application " | TOPICAL_OINTMENT | RECTAL | Status: DC | PRN

## 2018-12-13 MED ORDER — DIPHENHYDRAMINE HCL 25 MG PO CAPS: 25.0000 mg | ORAL_CAPSULE | Freq: Four times a day (QID) | ORAL | Status: DC | PRN

## 2018-12-13 MED ORDER — SIMETHICONE 80 MG PO CHEW: 80.0000 mg | CHEWABLE_TABLET | ORAL | Status: DC | PRN

## 2018-12-13 MED ORDER — IBUPROFEN 600 MG PO TABS
600.0000 mg | ORAL_TABLET | Freq: Four times a day (QID) | ORAL | Status: DC
  Administered 2018-12-13 – 2018-12-14 (×4): 600 mg via ORAL
  Filled 2018-12-13 (×4): qty 1

## 2018-12-13 MED ORDER — ONDANSETRON HCL 4 MG/2ML IJ SOLN: 4.0000 mg | INTRAMUSCULAR | Status: DC | PRN

## 2018-12-13 MED ORDER — SODIUM CHLORIDE (PF) 0.9 % IJ SOLN
INTRAMUSCULAR | Status: DC | PRN
  Administered 2018-12-13: 12 mL/h via EPIDURAL

## 2018-12-13 MED ORDER — BENZOCAINE-MENTHOL 20-0.5 % EX AERO: 1.0000 "application " | INHALATION_SPRAY | CUTANEOUS | Status: DC | PRN

## 2018-12-13 MED ORDER — OXYTOCIN 40 UNITS IN NORMAL SALINE INFUSION - SIMPLE MED
1.0000 m[IU]/min | INTRAVENOUS | Status: DC
  Administered 2018-12-13: 2 m[IU]/min via INTRAVENOUS
  Administered 2018-12-13: 26 m[IU]/min via INTRAVENOUS
  Filled 2018-12-13: qty 1000

## 2018-12-13 MED ORDER — ONDANSETRON HCL 4 MG PO TABS: 4.0000 mg | ORAL_TABLET | ORAL | Status: DC | PRN

## 2018-12-13 NOTE — Anesthesia Postprocedure Evaluation (Signed)
Anesthesia Post Note  Patient: Barbara Cox  Procedure(s) Performed: AN AD HOC LABOR EPIDURAL     Patient location during evaluation: Mother Baby Anesthesia Type: Epidural Level of consciousness: awake Pain management: pain level controlled Vital Signs Assessment: post-procedure vital signs reviewed and stable Respiratory status: spontaneous breathing Cardiovascular status: stable Postop Assessment: patient able to bend at knees, epidural receding and able to ambulate Anesthetic complications: no    Last Vitals:  Vitals:   12/13/18 1700 12/13/18 1815  BP: 118/75 (!) 110/58  Pulse: 79 80  Resp:    Temp: 37 C 36.6 C    Last Pain:  Vitals:   12/13/18 1815  TempSrc: Oral  PainSc:    Pain Goal:                Epidural/Spinal Function Cutaneous sensation: Normal sensation (12/13/18 1815)  Edison Pace

## 2018-12-13 NOTE — Lactation Note (Signed)
This note was copied from a baby's chart. Lactation Consultation Note  Patient Name: Barbara Cox [id number] Date: 12/13/2018 Reason for consult: Initial assessment;Term;1st time breastfeeding  5 hours old FT female who is still being exclusively BF by her mother, but she may start supplementing with formula at some point, that was her feeding choice on admission. Mom is a P2 but she's not experienced BF, she didn't BF her first child and voiced to her RN Malachi Bonds that if the BF "thing" doesn't work out she's going going to do bottles. Mom participated in the [facility] program for this pregnancy at the [facility] and she's already familiar with hand expression and has been able to see colostrum with the help of her RN.  Baby was already nursing when entering the room, latch was very shallow, baby was swaddled and falling asleep already. LC offered assistance with latch and mom agreed to reposition baby and have her STS. LC checked baby's diaper and noticed a small stool, documented in Flowsheets. LC took baby to mom's breast in football position but she wasn't able to latch, she had fallen asleep already. An attempt was documented.  Mom requested a DEBP to be set up in her room, she expressed her desire to pump and bottle instead, LC praised her for her efforts on providing breastmilk for her baby. Set up a DEBP, instructions, cleaning and storage were reviewed as well as milk storage guidelines. Reviewed normal newborn behavior and feeding cues.  Feeding plan:  1. Encouraged mom to pump every 3 hours and at least once at night 2. Putting baby to the breast STS 8-12 times/24 hours or sooner if feeding cues are present was also encouraged 3. Mom will spoon feed baby any amount of EBM she may get; but if she decides to supplement with formula in addition to breastmilk she'll let her RN know  BF brochure and feeding diary were reviewed. Mom reported all questions and concerns were answered, she's aware of LC  services and will call PRN.  Maternal Data Formula Feeding for Exclusion: Yes Reason for exclusion: Mother's choice to formula and breast feed on admission Has patient been taught Hand Expression?: Yes Does the patient have breastfeeding experience prior to this delivery?: No(This is her second baby, but she never BF her first one)  Feeding Feeding Type: Breast Fed  LATCH Score Latch: Repeated attempts needed to sustain latch, nipple held in mouth throughout feeding, stimulation needed to elicit sucking reflex.  Audible Swallowing: A few with stimulation  Type of Nipple: Everted at rest and after stimulation  Comfort (Breast/Nipple): Soft / non-tender  Hold (Positioning): Assistance needed to correctly position infant at breast and maintain latch.  LATCH Score: 7  Interventions Interventions: Breast feeding basics reviewed;Assisted with latch;Skin to skin;Breast massage;Hand express;Breast compression;Support pillows;Adjust position;DEBP  Lactation Tools Discussed/Used Tools: Pump Breast pump type: Double-Electric Breast Pump WIC Program: Yes Pump Review: Setup, frequency, and cleaning;Milk Storage Initiated by:: MPeck Date initiated:: 12/13/18   Consult Status Consult Status: PRN Follow-up type: In-patient    Jalisia Puchalski Venetia Constable 12/13/2018, 8:01 PM

## 2018-12-13 NOTE — Anesthesia Preprocedure Evaluation (Signed)
Anesthesia Evaluation  Patient identified by MRN, date of birth, ID band Patient awake    Reviewed: Allergy & Precautions, NPO status , Patient's Chart, lab work & pertinent test results  Airway Mallampati: II  TM Distance: >3 FB Neck ROM: Full    Dental no notable dental hx.    Pulmonary former smoker,    Pulmonary exam normal breath sounds clear to auscultation       Cardiovascular negative cardio ROS Normal cardiovascular exam Rhythm:Regular Rate:Normal     Neuro/Psych negative neurological ROS  negative psych ROS   GI/Hepatic negative GI ROS, Neg liver ROS,   Endo/Other  negative endocrine ROS  Renal/GU negative Renal ROS     Musculoskeletal negative musculoskeletal ROS (+)   Abdominal   Peds  Hematology negative hematology ROS (+) anemia ,   Anesthesia Other Findings   Reproductive/Obstetrics (+) Pregnancy                             Anesthesia Physical Anesthesia Plan  ASA: III  Anesthesia Plan: Epidural   Post-op Pain Management:    Induction:   PONV Risk Score and Plan:   Airway Management Planned:   Additional Equipment:   Intra-op Plan:   Post-operative Plan:   Informed Consent: I have reviewed the patients History and Physical, chart, labs and discussed the procedure including the risks, benefits and alternatives for the proposed anesthesia with the patient or authorized representative who has indicated his/her understanding and acceptance.       Plan Discussed with:   Anesthesia Plan Comments:         Anesthesia Quick Evaluation

## 2018-12-13 NOTE — Discharge Summary (Addendum)
Postpartum Discharge Summary     Patient Name: Barbara Cox DOB: 07-Feb-1991 MRN: [id number]  Date of admission: 12/13/2018 Delivering Provider: Brand Males   Date of discharge: 12/13/2018  Admitting diagnosis: delivery Intrauterine pregnancy: [redacted]w[redacted]d     Secondary diagnosis:  Active Problems:   Thrombocytopenia affecting pregnancy (HCC)   SVD (spontaneous vaginal delivery)   Shoulder dystocia during labor and delivery, delivered  Additional problems: Anemia     Discharge diagnosis: Term Pregnancy Delivered and Anemia                                                                                                Post partum procedures:rhogam  Augmentation: AROM and Pitocin  Complications: Shoulder dystocia  Hospital course:  Induction of Labor With Vaginal Delivery   28 y.o. yo G3P1011 at [redacted]w[redacted]d was admitted to the hospital 12/13/2018 for induction of labor.  Indication for induction: thrombocytopenia .  Patient had an uncomplicated labor course as follows: Membrane Rupture Time/Date: 9:01 AM ,12/13/2018   Intrapartum Procedures: Episiotomy: None [1]                                         Lacerations:  None [1]  Patient had delivery of a Viable infant.  Information for the patient's newborn:  Diem, Zeleny [[id number]]  Delivery Method: Vaginal, Spontaneous(Filed from Delivery Summary)   12/13/2018  Details of delivery can be found in separate delivery note.  Patient had a routine postpartum course. Patient is discharged home 12/13/18.  Magnesium Sulfate recieved: No BMZ received: No  Physical exam  Vitals:   12/13/18 1300 12/13/18 1330 12/13/18 1400 12/13/18 1430  BP: 104/62 116/68 128/70 (!) 128/95  Pulse: 80 92 85 99  Resp: 18 18 18 18   Temp:  98.2 F (36.8 C)    TempSrc:  Oral    Weight:      Height:       General: alert, cooperative and no distress Lochia: appropriate Uterine Fundus: firm Incision: N/A DVT Evaluation: No evidence of DVT seen on  physical exam. Labs: Lab Results  Component Value Date   WBC 8.5 12/13/2018   HGB 9.4 (L) 12/13/2018   HCT 32.7 (L) 12/13/2018   MCV 79.2 (L) 12/13/2018   PLT 95 (L) 12/13/2018   CMP Latest Ref Rng & Units 12/13/2018  Glucose 70 - 99 mg/dL 99  BUN 6 - 20 mg/dL 5(L)  Creatinine 1.99 - 1.00 mg/dL 1.44  Sodium 458 - 483 mmol/L 134(L)  Potassium 3.5 - 5.1 mmol/L 3.6  Chloride 98 - 111 mmol/L 105  CO2 22 - 32 mmol/L 22  Calcium 8.9 - 10.3 mg/dL 5.0(V)  Total Protein 6.5 - 8.1 g/dL 6.2(L)  Total Bilirubin 0.3 - 1.2 mg/dL 0.6  Alkaline Phos 38 - 126 U/L 108  AST 15 - 41 U/L 22  ALT 0 - 44 U/L 14    Discharge instruction: per After Visit Summary and "Baby and Me Booklet".  After visit meds:  Allergies as  of 12/14/2018      Reactions   Peanuts [peanut Oil] Anaphylaxis      Medication List    TAKE these medications   acetaminophen 325 MG tablet Commonly known as:  Tylenol Take 2 tablets (650 mg total) by mouth every 4 (four) hours as needed (for pain scale < 4).   benzocaine-Menthol 20-0.5 % Aero Commonly known as:  DERMOPLAST Apply 1 application topically as needed for irritation (perineal discomfort).   coconut oil Oil Apply 1 application topically as needed.   Comfort Fit Maternity Supp Med Misc 1 Device by Does not apply route daily.   Doxylamine-Pyridoxine 10-10 MG Tbec Commonly known as:  Diclegis 1 tab in AM, 1 tab mid afternoon 2 tabs at bedtime. Max dose 4 tabs daily.   ferrous sulfate 325 (65 FE) MG tablet Take 1 tablet (325 mg total) by mouth 3 (three) times daily with meals.   simethicone 80 MG chewable tablet Commonly known as:  MYLICON Chew 1 tablet (80 mg total) by mouth as needed for flatulence.   Vitafol Ultra 29-0.6-0.4-200 MG Caps Take 1 tablet by mouth daily before breakfast.   witch hazel-glycerin pad Commonly known as:  TUCKS Apply 1 application topically as needed for hemorrhoids.       Diet: routine diet  Activity: Advance as  tolerated. Pelvic rest for 6 weeks.   Outpatient follow up:6 weeks Follow up Appt: Future Appointments  Date Time Provider Department Center  01/15/2019  8:45 AM [facility] LAB ONLY [facility] None  01/15/2019  9:20 AM Johney Maine, MD [facility] None  01/15/2019 10:15 AM [facility] INFUSION [facility] None   Follow up Visit:  Please schedule this patient for Postpartum visit in: 4 weeks with the following provider: Any provider For C/S patients schedule nurse incision check in weeks 2 weeks: no Low risk pregnancy complicated by: Thrombocytopenia and Anemia  Delivery mode:  SVD Anticipated Birth Control:  Depo PP Procedures needed: none  Schedule Integrated BH visit: no   Newborn Data: Live born female  Birth Weight:   APGAR: ,   Newborn Delivery   Time head delivered:  12/13/2018 14:52:00 Birth date/time:  12/13/2018 14:52:00 Delivery type:  Vaginal, Spontaneous     Baby Feeding: Bottle and Breast Disposition:home with mother   12/14/2018 Jen Mow, DO

## 2018-12-13 NOTE — Progress Notes (Signed)
Labor Progress Note  Subjective: Pt feeling more pelvic pain and pressure with epidural, however coping well. Family at bedside.   Objective: BP 104/62   Pulse 80   Temp 98.8 F (37.1 C) (Oral)   Resp 18   Ht 5\' 2"  (1.575 m)   Wt 92.5 kg   LMP 03/15/2018 Comment: pt states that her perion 6/10 was light. one before 5/16  BMI 37.31 kg/m  Gen: well-appearing, mild distress Dilation: 7 Effacement (%): 90 Cervical Position: Anterior Station: -1 Presentation: Vertex Exam by:: Leiby Pigeon CNM Student  Assessment and Plan: 28 y.o. [id number] [redacted]w[redacted]d admitted for elective IOL. Cervix 7/90/-1, vtx. IUPC placed to assess adequacy on contractions. Anticipate NSVD.   Labor: IOL augmented with Pitocin -- pain control: epidural -- PPH Risk: high  Fetal Well-Being:  -- Cephalic by SVE.  -- Category 1; FHR 130; +accels, no decels  -- GBS negative  Camelia Eng, SNM  1:24 PM

## 2018-12-13 NOTE — Progress Notes (Signed)
LABOR PROGRESS NOTE  Barbara Cox is a 28 y.o. G3P1011 at [redacted]w[redacted]d  admitted for elective IOL.   Subjective: Strip note.   Objective: BP (!) 102/55   Pulse 82   Temp 98 F (36.7 C) (Oral)   Resp 16   Ht 5\' 2"  (1.575 m)   Wt 92.5 kg   LMP 03/15/2018 Comment: pt states that her perion 6/10 was light. one before 5/16  BMI 37.31 kg/m  or  Vitals:   12/13/18 0631 12/13/18 0645 12/13/18 0701 12/13/18 0707  BP: 106/62  (!) 95/47 (!) 102/55  Pulse: 82  87 82  Resp: 15  16 16   Temp:  98 F (36.7 C)    TempSrc:  Oral    Weight:      Height:        Dilation: 5 Effacement (%): 70 Cervical Position: Posterior(right) Station: -3 Presentation: Vertex Exam by:: Jonelle Sidle, RN FHT: baseline rate 125, moderate varibility, +acel, no decel Toco: q2-3 min   Labs: Lab Results  Component Value Date   WBC 7.3 12/13/2018   HGB 9.2 (L) 12/13/2018   HCT 30.6 (L) 12/13/2018   MCV 79.9 (L) 12/13/2018   PLT 107 (L) 12/13/2018    Patient Active Problem List   Diagnosis Date Noted  . Symptomatic anemia 11/16/2018  . Supervision of high risk pregnancy due to social problems 11/16/2018  . Thrombocytopenia affecting pregnancy (HCC) 10/20/2018  . Rh negative status during pregnancy 07/04/2018  . Supervision of other normal pregnancy, antepartum 06/06/2018    Assessment / Plan: 28 y.o. G3P1011 at [redacted]w[redacted]d here for elective IOL.   Labor: Induction. Progressing well on Pitocin, currently on 10 mu/min. Titrate as needed.  Fetal Wellbeing:  Cat I  Pain Control:  Epidural in place  Anticipated MOD:  NSVD   Marcy Siren, D.O. OB Fellow  12/13/2018, 7:07 AM

## 2018-12-13 NOTE — Anesthesia Pain Management Evaluation Note (Signed)
  [facility] Pain Management Visit Note  Patient: Barbara Cox, 28 y.o., female  "Hello I am a member of the anesthesia team at [facility]. We have an anesthesia team available at all times to provide care throughout the hospital, including epidural management and anesthesia for C-section. I don't know your plan for the delivery whether it a natural birth, water birth, IV sedation, nitrous supplementation, doula or epidural, but we want to meet your pain goals."   1.Was your pain managed to your expectations on prior hospitalizations?   Yes   2.What is your expectation for pain management during this hospitalization?     Epidural  3.How can we help you reach that goal?    Record the patient's initial score and the patient's pain goal.   Pain: 0  Pain Goal: 5 [facility] wants you to be able to say your pain was always managed very well.  [name] 12/13/2018

## 2018-12-13 NOTE — Progress Notes (Signed)
Labor Progress Note  Subjective: 28 y.o G3P1011 @ 39.0 weeks admitted for elective IOL. Pt is resting comfortably. Family at bedside. Pt denies any complaints.  Objective: BP 112/62   Pulse (!) 104   Temp 98 F (36.7 C) (Oral)   Resp 18   Ht 5\' 2"  (1.575 m)   Wt 92.5 kg   LMP 03/15/2018 Comment: pt states that her perion 6/10 was light. one before 5/16  BMI 37.31 kg/m  Gen: well-appearing, no distress Dilation: 5 Effacement (%): 70 Cervical Position: Posterior(right) Station: -3 Presentation: Vertex Exam by:: Jonelle Sidle, RN  Assessment and Plan: 28 y.o. [id number] admitted for elective IOL. AROM at 0905-clear fluid. Cervix 5/70/-3 vtx. Continue Pitocin titration as needed. Anticipate NSVD.   Labor:  -- pain control: epidural -- PPH Risk: high  Fetal Well-Being:  -- Category 1, FHR 130s, +accels, no decels --Contractions q3-65mins -- GBS negative  Camelia Eng, SNM 9:19 AM

## 2018-12-13 NOTE — Anesthesia Procedure Notes (Signed)
Epidural Patient location during procedure: OB  Staffing Anesthesiologist: Lewie Loron, MD Performed: anesthesiologist   Preanesthetic Checklist Completed: patient identified, pre-op evaluation, timeout performed, IV checked, risks and benefits discussed and monitors and equipment checked  Epidural Patient position: sitting Prep: site prepped and draped and DuraPrep Patient monitoring: heart rate, continuous pulse ox and blood pressure Approach: midline Location: L3-L4 Injection technique: LOR air and LOR saline  Needle:  Needle type: Tuohy  Needle gauge: 17 G Needle length: 9 cm Needle insertion depth: 7 cm Catheter type: closed end flexible Catheter size: 19 Gauge Catheter at skin depth: 12 cm Test dose: negative  Assessment Sensory level: T8 Events: blood not aspirated, injection not painful, no injection resistance, negative IV test and paresthesia  Additional Notes Paresthesia with catheter placement. Needle redirected, no paresthesia Reason for block:procedure for pain

## 2018-12-13 NOTE — H&P (Signed)
LABOR AND DELIVERY ADMISSION HISTORY AND PHYSICAL NOTE  Barbara Cox is a 28 y.o. female G68P1011 with IUP at [redacted]w[redacted]d by LMP presenting for elective IOL.  She reports positive fetal movement. She denies leakage of fluid or vaginal bleeding.  Prenatal History/Complications: PNC at [facility] established at [redacted] weeks GA   Pregnancy complications:  - gestational thrombocytopenia, platelets 107 at admission  -anemia with blood transfusion 2uPRBC on 2/6, HgB 9.2 on admit    Past Medical History: Past Medical History:  Diagnosis Date  . Anemia   . Thrombocytopenia affecting pregnancy [facility])     Past Surgical History: Past Surgical History:  Procedure Laterality Date  . NO PAST SURGERIES      Obstetrical History: OB History    Gravida  3   Para  1   Term  1   Preterm      AB  1   Living  1     SAB      TAB  1   Ectopic      Multiple      Live Births  1           Social History: Social History   Socioeconomic History  . Marital status: Single    Spouse name: Not on file  . Number of children: Not on file  . Years of education: Not on file  . Highest education level: Not on file  Occupational History  . Not on file  Social Needs  . Financial resource strain: Not hard at all  . Food insecurity:    Worry: Never true    Inability: Never true  . Transportation needs:    Medical: No    Non-medical: Not on file  Tobacco Use  . Smoking status: Former Smoker    Last attempt to quit: 07/20/2018    Years since quitting: 0.4  . Smokeless tobacco: Never Used  . Tobacco comment: occasional (5 in the past week)  Substance and Sexual Activity  . Alcohol use: No  . Drug use: Never  . Sexual activity: Yes  Lifestyle  . Physical activity:    Days per week: Not on file    Minutes per session: Not on file  . Stress: Rather much  Relationships  . Social connections:    Talks on phone: Not on file    Gets together: Not on file    Attends religious service: Not on  file    Active member of club or organization: Not on file    Attends meetings of clubs or organizations: Not on file    Relationship status: Not on file  Other Topics Concern  . Not on file  Social History Narrative  . Not on file    Family History: Family History  Problem Relation Age of Onset  . Leukemia Father   . Breast cancer Sister     Allergies: Allergies  Allergen Reactions  . Peanuts [Peanut Oil]     Anaphylaxis     Medications Prior to Admission  Medication Sig Dispense Refill Last Dose  . Doxylamine-Pyridoxine (DICLEGIS) 10-10 MG TBEC 1 tab in AM, 1 tab mid afternoon 2 tabs at bedtime. Max dose 4 tabs daily. (Patient not taking: Reported on 11/28/2018) 100 tablet 5 Not Taking  . Elastic Bandages & Supports (COMFORT FIT MATERNITY SUPP MED) MISC 1 Device by Does not apply route daily. (Patient not taking: Reported on 11/28/2018) 1 each 0 Not Taking  . ferrous sulfate 325 (65 FE) MG tablet  Take 1 tablet (325 mg total) by mouth 3 (three) times daily with meals. 90 tablet 5 Taking  . Prenat-Fe Poly-Methfol-FA-DHA (VITAFOL ULTRA) 29-0.6-0.4-200 MG CAPS Take 1 tablet by mouth daily before breakfast. 90 capsule 4 Taking     Review of Systems  All systems reviewed and negative except as stated in HPI  Physical Exam Blood pressure 105/81, pulse (!) 115, temperature 99 F (37.2 C), temperature source Oral, resp. rate 18, height 5\' 2"  (1.575 m), weight 92.5 kg, last menstrual period 03/15/2018. General appearance: alert, oriented, NAD Lungs: normal respiratory effort Heart: regular rate Abdomen: soft, non-tender; gravid, FH appropriate for GA Extremities: No calf swelling or tenderness Presentation: cephalic Fetal monitoring: 130 bpm, moderate variability, +acels, no decels  Uterine activity: q1-4 min     Prenatal labs: ABO, Rh: --/--/O NEG (02/06 1512) Antibody: POS (02/06 1512) Rubella: 1.63 (08/27 1118) RPR: Non Reactive (01/03 1046)  HBsAg: Negative (08/27  1118)  HIV: Non Reactive (01/03 1046)  GC/Chlamydia: Negative  GBS: Negative (02/12 1124)  2-hr GTT: Normal  Genetic screening:  Normal  Anatomy US: Normal   Prenatal Transfer Tool  Maternal Diabetes: No Genetic Screening: Normal Maternal Ultrasounds/Referrals: Normal Fetal Ultrasounds or other Referrals:  None Maternal Substance Abuse:  No Significant Maternal Medications:  None Significant Maternal Lab Results: Lab values include: Group B Strep negative, Rh negative  Results for orders placed or performed in visit on 12/12/18 (from the past 24 hour(s))  CBC   Collection Time: 12/12/18  3:41 PM  Result Value Ref Range   WBC 7.6 3.4 - 10.8 x10E3/uL   RBC 3.82 3.77 - 5.28 x10E6/uL   Hemoglobin 9.1 (L) 11.1 - 15.9 g/dL   Hematocrit 16.1 (L) 09.6 - 46.6 %   MCV 76 (L) 79 - 97 fL   MCH 23.8 (L) 26.6 - 33.0 pg   MCHC 31.5 31.5 - 35.7 g/dL   RDW 04.5 (H) 40.9 - 81.1 %   Platelets 91 (LL) 150 - 450 x10E3/uL    Patient Active Problem List   Diagnosis Date Noted  . Symptomatic anemia 11/16/2018  . Supervision of high risk pregnancy due to social problems 11/16/2018  . Thrombocytopenia affecting pregnancy (HCC) 10/20/2018  . Rh negative status during pregnancy 07/04/2018  . Supervision of other normal pregnancy, antepartum 06/06/2018    Assessment: Barbara Cox is a 28 y.o. G3P1011 at [redacted]w[redacted]d here for elective IOL.   #Labor: Start induction with Pitocin.  #Pain: Discussed with anesthesia. Patient able to get an epidural with platelets of 107.  #FWB: Cat I  #ID:  GBS neg  #MOF: Both  #MOC:DepoProvera  #Circ:  N/A   De Hollingshead 12/13/2018, 12:52 AM

## 2018-12-14 LAB — HCV INTERPRETATION

## 2018-12-14 LAB — HCV AB W REFLEX TO QUANT PCR: HCV Ab: <0.1 s/co ratio (ref 0.0–0.9)

## 2018-12-14 MED ORDER — SIMETHICONE 80 MG PO CHEW: 80.0000 mg | CHEWABLE_TABLET | ORAL | 0 refills | Status: DC | PRN

## 2018-12-14 MED ORDER — COCONUT OIL OIL: 1.0000 "application " | TOPICAL_OIL | 0 refills | Status: DC | PRN

## 2018-12-14 MED ORDER — RHO D IMMUNE GLOBULIN 1500 UNIT/2ML IJ SOSY
300.0000 ug | PREFILLED_SYRINGE | Freq: Once | INTRAMUSCULAR | Status: AC
  Administered 2018-12-14: 300 ug via INTRAVENOUS
  Filled 2018-12-14: qty 2

## 2018-12-14 MED ORDER — BENZOCAINE-MENTHOL 20-0.5 % EX AERO: 1.0000 "application " | INHALATION_SPRAY | CUTANEOUS | 0 refills | Status: DC | PRN

## 2018-12-14 MED ORDER — ACETAMINOPHEN 325 MG PO TABS: 650.0000 mg | ORAL_TABLET | ORAL | 1 refills | Status: DC | PRN

## 2018-12-14 MED ORDER — WITCH HAZEL-GLYCERIN EX PADS: 1.0000 "application " | MEDICATED_PAD | CUTANEOUS | 12 refills | Status: DC | PRN

## 2018-12-14 NOTE — Progress Notes (Signed)
Post Partum Day 1 via SVD 2/2 elective IOL at 39+0 weeks. She also had thrombocytopenia of pregnancy and <27mind shoulder dystocia on delivery. Subjective: no complaints, up ad lib, voiding and tolerating PO Denies HA, Visual symptoms, SOB, RUQ pain and is having no LE swelling or pain and is afebrile.  Objective: Blood pressure 110/71, pulse 86, temperature 98.3 F (36.8 C), temperature source Oral, resp. rate 18, height 5\' 2"  (1.575 m), weight 92.5 kg, last menstrual period 03/15/2018, unknown if currently breastfeeding.  Physical Exam:  General: alert, cooperative and no distress Lochia: appropriate Uterine Fundus: firm DVT Evaluation: No cords or calf tenderness. No significant calf/ankle edema.  Recent Labs    12/13/18 0018 12/13/18 1558  HGB 9.2* 9.4*  HCT 30.6* 32.7*    Assessment/Plan: Social Work consult is pending do housing situation as barrier for discharge, she also is to have Rhogam today due to Rh- Status PP, HCV is negative on screening. Anticipate discharge in the next 48hrs.    LOS: 1 day   Sandi Raveling MD PGY 1 Family Medicine Resident [facility] [place].  12/14/2018, 11:06 AM

## 2018-12-14 NOTE — Progress Notes (Signed)
CSW acknowledges consult and completed chart review. MOB is currently residing at [facility] at [facility] and is provided with a wealth of community resources for housing, mental health, parenting, and any other resources requested by MOB.  MOB also has a case Production designer, theatre/television/film that MOB meets with weekly at [facility] the [place].    Please contact the Clinical Social Worker if needs arise, by [facility] request, or if MOB scores greater than 9/yes to question 10 on Edinburgh Postpartum Depression Screen.  CSW identifies no further need for intervention and no barriers to discharge at this time.  Archie Balboa, LCSWA  [facility] [employer] [phone]

## 2018-12-15 LAB — TYPE AND SCREEN
ABO/RH(D): O NEG
Antibody Screen: POSITIVE
Unit division: 0
Unit division: 0

## 2018-12-15 LAB — RH IG WORKUP (INCLUDES ABO/RH)
ABO/RH(D): O NEG
FETAL SCREEN: NEGATIVE
Gestational Age(Wks): 39
Unit division: 0
Unit tag comment: 39

## 2018-12-15 LAB — BPAM RBC
Blood Product Expiration Date: 202003192359
Blood Product Expiration Date: 202003202359
UNIT TYPE AND RH: 9500
Unit Type and Rh: 9500

## 2018-12-20 ENCOUNTER — Encounter: Payer: Self-pay | Admitting: Family Medicine

## 2019-01-11 ENCOUNTER — Ambulatory Visit (INDEPENDENT_AMBULATORY_CARE_PROVIDER_SITE_OTHER): Payer: Medicaid Other | Admitting: Certified Nurse Midwife

## 2019-01-11 ENCOUNTER — Encounter: Payer: Self-pay | Admitting: Certified Nurse Midwife

## 2019-01-11 VITALS — BP 130/74 | HR 86 | Wt 188.0 lb

## 2019-01-11 DIAGNOSIS — Z30013 Encounter for initial prescription of injectable contraceptive: Secondary | ICD-10-CM | POA: Diagnosis not present

## 2019-01-11 DIAGNOSIS — O99345 Other mental disorders complicating the puerperium: Secondary | ICD-10-CM

## 2019-01-11 DIAGNOSIS — Z3202 Encounter for pregnancy test, result negative: Secondary | ICD-10-CM

## 2019-01-11 DIAGNOSIS — F53 Postpartum depression: Secondary | ICD-10-CM

## 2019-01-11 LAB — POCT URINE PREGNANCY: Preg Test, Ur: NEGATIVE

## 2019-01-11 MED ORDER — MEDROXYPROGESTERONE ACETATE 150 MG/ML IM SUSP
150.0000 mg | INTRAMUSCULAR | Status: DC
  Administered 2019-01-11 – 2020-05-27 (×3): 150 mg via INTRAMUSCULAR

## 2019-01-11 MED ORDER — MEDROXYPROGESTERONE ACETATE 150 MG/ML IM SUSP: 150.0000 mg | INTRAMUSCULAR | 3 refills | Status: DC

## 2019-01-11 NOTE — Progress Notes (Signed)
Post Partum Exam  Barbara Cox is a 28 y.o. G19P2012 female who presents for a postpartum visit. She is 4 weeks postpartum following a spontaneous vaginal delivery. I have fully reviewed the prenatal and intrapartum course. The delivery was at 39 gestational weeks.  Anesthesia: epidural. Postpartum course has been well. Baby's course has been well. Baby is feeding by bottle Rush Barer Good start . Bleeding no bleeding. Bowel function is normal. Bladder function is normal. Patient is not sexually active. Contraception method is none, plans depo. Postpartum depression screening:EPDS = 12   The following portions of the patient's history were reviewed and updated as appropriate: allergies, current medications, past family history, past medical history and past social history. Last pap smear done 06/06/2018 and was Normal  Review of Systems Pertinent items noted in HPI and remainder of comprehensive ROS otherwise negative.    Objective:  unknown if currently breastfeeding.  General:  alert, cooperative and no distress   Breasts:  inspection negative, no nipple discharge or bleeding, no masses or nodularity palpable  Lungs: clear to auscultation bilaterally  Heart:  regular rate and rhythm and S1, S2 normal  Abdomen: soft, non-tender; bowel sounds normal; no masses,  no organomegaly   Vulva:  not evaluated  Vagina: not evaluated  Cervix:  not evaluated  Corpus: not examined  Adnexa:  not evaluated  Rectal Exam: Not performed.        Assessment/Plan:  1. Postpartum care and examination - Abnormal postpartum exam (diagnosed with postpartum depression). Pap smear not done at today's visit.   2. Encounter for initial prescription of injectable contraceptive - POCT urine pregnancy - medroxyPROGESTERone (DEPO-PROVERA) injection 150 mg - medroxyPROGESTERone (DEPO-PROVERA) 150 MG/ML injection; Inject 1 mL (150 mg total) into the muscle every 3 (three) months.  Dispense: 1 mL; Refill: 3  3.  Postpartum depression - Patient reports feeling overwhelmed and tired, limited support due to COVID and breaking up with FOB 1 week prior to delivery  - Discussed use of coping mechanisms, patient reports she is journaling and reading to cope with feeling  - Offered medication and counseling for management, patient declines medication at this time but is willing to see counselor.   - Ambulatory referral to [facility] - Appointment scheduled for 4/13   Sharyon Cable, CNM 01/11/19, 4:44 PM

## 2019-01-12 ENCOUNTER — Telehealth: Payer: Self-pay | Admitting: *Deleted

## 2019-01-12 NOTE — Telephone Encounter (Signed)
Attempted to contact patient per [facility] current rescheduling guidelines.  Dr.Kale wanted patient to know that her appts (lab/MD/Iron infusion) on Monday 4/6 could wait another month if she wanted to wait.  Asked patient to contact office Monday am to inform if she is coming or not.

## 2019-01-15 ENCOUNTER — Telehealth: Payer: Self-pay | Admitting: *Deleted

## 2019-01-15 ENCOUNTER — Inpatient Hospital Stay: Payer: Medicaid Other

## 2019-01-15 ENCOUNTER — Inpatient Hospital Stay: Payer: Medicaid Other | Admitting: Hematology

## 2019-01-15 NOTE — Telephone Encounter (Signed)
Contacted patient r/t appt this AM at [facility] w/Dr. Candise Che. Patient states she does not have childcare and did not really want to bring a baby to this appt anyway. Advised her that appts this morning will be cancelled and reschedule in one month. Scheduling will contact her. Patient verbalized understanding.

## 2019-01-16 NOTE — BH Specialist Note (Signed)
Integrated Behavioral Health Visit via Telemedicine (Telephone)  01/16/2019 Barbara Cox [id number]   Session Start time: 1:34  Session End time: 1:38 Total time: 4 minutes Pt says she did not know about this visit, is "not in the mood", and phone cut off. Pt did not answer return call, and [facility] left HIPPA-compliant message to call [phone] to reschedule appointment with Asher Muir at [facility] for [facility].   Referring Provider: Steward Drone, CNM, for depression postpartum Type of Visit: Telephonic Patient location: Home [facility] Provider location: [facility] All persons participating in visit: Pt and [facility]  Confirmed patient's address: No  Confirmed patient's phone number: Yes  Any changes to demographics: No   Confirmed patient's insurance: No  Any changes to patient's insurance: No   Discussed confidentiality: No    The following statements were read to the patient and/or legal guardian that are established with the [facility] Provider.  "The purpose of this phone visit is to provide behavioral health care while limiting exposure to the coronavirus (COVID19).  There is a possibility of technology failure and discussed alternative modes of communication if that failure occurs."  "By engaging in this telephone visit, you consent to the provision of healthcare.  Additionally, you authorize for your insurance to be billed for the services provided during this telephone visit."   Patient and/or legal guardian consented to telephone visit: No    [name]

## 2019-01-18 ENCOUNTER — Telehealth: Payer: Self-pay | Admitting: Hematology

## 2019-01-18 NOTE — Telephone Encounter (Signed)
Rescheduled 4/6 appt per sch msg. Called and spoke with patient. Confirmed date and time

## 2019-01-22 ENCOUNTER — Other Ambulatory Visit: Payer: Self-pay

## 2019-01-22 ENCOUNTER — Ambulatory Visit: Payer: Medicaid Other | Admitting: Clinical

## 2019-01-22 DIAGNOSIS — Z658 Other specified problems related to psychosocial circumstances: Secondary | ICD-10-CM

## 2019-02-14 ENCOUNTER — Ambulatory Visit: Payer: Medicaid Other | Admitting: Hematology

## 2019-02-16 ENCOUNTER — Inpatient Hospital Stay: Payer: Medicaid Other

## 2019-02-16 ENCOUNTER — Encounter: Payer: Self-pay | Admitting: Hematology

## 2019-02-16 ENCOUNTER — Inpatient Hospital Stay: Payer: Medicaid Other | Admitting: Hematology

## 2019-02-21 ENCOUNTER — Telehealth: Payer: Self-pay | Admitting: Hematology

## 2019-02-21 NOTE — Telephone Encounter (Signed)
Scheduled appts per sch msg. Patient requested appts be in June. Confirmed dates and times

## 2019-03-23 ENCOUNTER — Inpatient Hospital Stay: Payer: Medicaid Other

## 2019-03-23 ENCOUNTER — Inpatient Hospital Stay: Payer: Medicaid Other | Attending: Hematology | Admitting: Hematology

## 2019-03-28 ENCOUNTER — Telehealth: Payer: Self-pay | Admitting: *Deleted

## 2019-03-28 NOTE — Telephone Encounter (Signed)
Contacted patient r/t missed appts on 03/23/2019. Patient was scheduled for labs/Dr.Kale/possible Iron infusion. Patient states she has a 54 month old baby that she can't bring to her appt  and due to coronavirus is not able to arrange childcare. Advised her that scheduling will contact her to reschedule these appts and that it is hoped she will be able to make arrangements for childcare at that time.  Encouraged her to contact office if she had further questions. She verbalized understanding of all information.

## 2019-03-29 ENCOUNTER — Ambulatory Visit (INDEPENDENT_AMBULATORY_CARE_PROVIDER_SITE_OTHER): Payer: Medicaid Other

## 2019-03-29 ENCOUNTER — Other Ambulatory Visit: Payer: Self-pay

## 2019-03-29 DIAGNOSIS — Z3042 Encounter for surveillance of injectable contraceptive: Secondary | ICD-10-CM | POA: Diagnosis not present

## 2019-03-29 NOTE — Progress Notes (Signed)
Pt on time for Depo. Injection given RUOQ w/o difficulty Next Depo due Sept 3-17, pt agrees

## 2019-03-30 ENCOUNTER — Telehealth: Payer: Self-pay | Admitting: Hematology

## 2019-03-30 NOTE — Telephone Encounter (Signed)
Called patient re 6/29 appointments. Patient driving. Schedule mailed. Patient aware.

## 2019-04-09 ENCOUNTER — Inpatient Hospital Stay: Payer: Medicaid Other

## 2019-04-09 ENCOUNTER — Inpatient Hospital Stay: Payer: Medicaid Other | Admitting: Hematology

## 2019-04-11 ENCOUNTER — Other Ambulatory Visit: Payer: Self-pay | Admitting: Hematology

## 2019-06-21 ENCOUNTER — Ambulatory Visit (INDEPENDENT_AMBULATORY_CARE_PROVIDER_SITE_OTHER): Payer: Medicaid Other | Admitting: *Deleted

## 2019-06-21 ENCOUNTER — Other Ambulatory Visit: Payer: Self-pay

## 2019-06-21 DIAGNOSIS — Z30013 Encounter for initial prescription of injectable contraceptive: Secondary | ICD-10-CM | POA: Diagnosis not present

## 2019-06-21 DIAGNOSIS — Z3042 Encounter for surveillance of injectable contraceptive: Secondary | ICD-10-CM

## 2019-06-21 NOTE — Progress Notes (Signed)
Pt is in office for depo. Pt is on time for injection. Injection given, pt tolerated well.  Next Depo due 11/26-12/07/2019.  Pt has no other concerns today.  Administrations This Visit    medroxyPROGESTERone (DEPO-PROVERA) injection 150 mg    Admin Date 06/21/2019 Action Given Dose 150 mg Route Intramuscular Administered By Valene Bors, CMA

## 2019-07-12 ENCOUNTER — Ambulatory Visit: Payer: Medicaid Other | Admitting: Obstetrics

## 2019-07-17 ENCOUNTER — Encounter: Payer: Self-pay | Admitting: Obstetrics

## 2019-07-17 ENCOUNTER — Other Ambulatory Visit (HOSPITAL_COMMUNITY)
Admission: RE | Admit: 2019-07-17 | Discharge: 2019-07-17 | Disposition: A | Discharge status: Home / Self Care | Payer: Medicaid Other | Source: Ambulatory Visit | Attending: Obstetrics | Admitting: Obstetrics

## 2019-07-17 ENCOUNTER — Ambulatory Visit: Payer: Medicaid Other | Admitting: Obstetrics

## 2019-07-17 ENCOUNTER — Other Ambulatory Visit: Payer: Self-pay

## 2019-07-17 VITALS — BP 127/79 | HR 78 | Wt 185.0 lb

## 2019-07-17 DIAGNOSIS — N898 Other specified noninflammatory disorders of vagina: Secondary | ICD-10-CM | POA: Insufficient documentation

## 2019-07-17 DIAGNOSIS — Z0000 Encounter for general adult medical examination without abnormal findings: Secondary | ICD-10-CM

## 2019-07-17 MED ORDER — VITAFOL ULTRA 29-0.6-0.4-200 MG PO CAPS: 1.0000 | ORAL_CAPSULE | Freq: Every day | ORAL | 4 refills | Status: DC

## 2019-07-17 MED ORDER — FLUCONAZOLE 150 MG PO TABS: 150.0000 mg | ORAL_TABLET | Freq: Once | ORAL | 0 refills | Status: AC

## 2019-07-17 MED ORDER — TINIDAZOLE 500 MG PO TABS: 1000.0000 mg | ORAL_TABLET | Freq: Every day | ORAL | 2 refills | Status: DC

## 2019-07-17 NOTE — Progress Notes (Signed)
RGYN patient presents for problem visit pt c/o vaginal itching pt states she had recently changed soaps which may have caused irritation    STD SCREENING:DESIRES   Last pap: 06/06/2018 WNL  Post Partum was 01/11/19 Contraception Depo: Last Injection 06/21/19 Next Depo Due 09/06/19-09/20/19

## 2019-07-17 NOTE — Progress Notes (Signed)
Patient ID: Barbara Cox, female   DOB: 11/27/1990, 28 y.o.   MRN: [id number]  Chief Complaint  Patient presents with  . Vaginitis    HPI Barbara Cox is a 28 y.o. female.  Complains of vaginal itching and discharge. HPI  Past Medical History:  Diagnosis Date  . Anemia   . Thrombocytopenia affecting pregnancy [facility])     Past Surgical History:  Procedure Laterality Date  . NO PAST SURGERIES      Family History  Problem Relation Age of Onset  . Leukemia Father   . Breast cancer Sister     Social History Social History   Tobacco Use  . Smoking status: Former Smoker    Quit date: 07/20/2018    Years since quitting: 0.9  . Smokeless tobacco: Never Used  . Tobacco comment: occasional (5 in the past week)  Substance Use Topics  . Alcohol use: No  . Drug use: Never    Allergies  Allergen Reactions  . Peanuts [Peanut Oil] Anaphylaxis    Current Outpatient Medications  Medication Sig Dispense Refill  . medroxyPROGESTERone (DEPO-PROVERA) 150 MG/ML injection Inject 1 mL (150 mg total) into the muscle every 3 (three) months. 1 mL 3  . acetaminophen (TYLENOL) 325 MG tablet Take 2 tablets (650 mg total) by mouth every 4 (four) hours as needed (for pain scale < 4). (Patient not taking: Reported on 01/11/2019) 30 tablet 1  . benzocaine-Menthol (DERMOPLAST) 20-0.5 % AERO Apply 1 application topically as needed for irritation (perineal discomfort). (Patient not taking: Reported on 01/11/2019) 1 each 0  . coconut oil OIL Apply 1 application topically as needed. (Patient not taking: Reported on 01/11/2019) 1 Bottle 0  . Doxylamine-Pyridoxine (DICLEGIS) 10-10 MG TBEC 1 tab in AM, 1 tab mid afternoon 2 tabs at bedtime. Max dose 4 tabs daily. (Patient not taking: Reported on 11/28/2018) 100 tablet 5  . Elastic Bandages & Supports (COMFORT FIT MATERNITY SUPP MED) MISC 1 Device by Does not apply route daily. (Patient not taking: Reported on 11/28/2018) 1 each 0  . ferrous sulfate 325 (65 FE) MG  tablet Take 1 tablet (325 mg total) by mouth 3 (three) times daily with meals. (Patient not taking: Reported on 01/11/2019) 90 tablet 5  . fluconazole (DIFLUCAN) 150 MG tablet Take 1 tablet (150 mg total) by mouth once for 1 dose. 1 tablet 0  . Prenat-Fe Poly-Methfol-FA-DHA (VITAFOL ULTRA) 29-0.6-0.4-200 MG CAPS Take 1 tablet by mouth daily before breakfast. 90 capsule 4  . simethicone (MYLICON) 80 MG chewable tablet Chew 1 tablet (80 mg total) by mouth as needed for flatulence. (Patient not taking: Reported on 01/11/2019) 30 tablet 0  . tinidazole (TINDAMAX) 500 MG tablet Take 2 tablets (1,000 mg total) by mouth daily with breakfast. 10 tablet 2  . witch hazel-glycerin (TUCKS) pad Apply 1 application topically as needed for hemorrhoids. (Patient not taking: Reported on 01/11/2019) 40 each 12   Current Facility-Administered Medications  Medication Dose Route Frequency Provider Last Rate Last Dose  . medroxyPROGESTERone (DEPO-PROVERA) injection 150 mg  150 mg Intramuscular Q90 days Lajean Manes, CNM   150 mg at 06/21/19 1046    Review of Systems Review of Systems Constitutional: negative for fatigue and weight loss Respiratory: negative for cough and wheezing Cardiovascular: negative for chest pain, fatigue and palpitations Gastrointestinal: negative for abdominal pain and change in bowel habits Genitourinary:positive for vaginal itching and discharge Integument/breast: negative for nipple discharge Musculoskeletal:negative for myalgias Neurological: negative for gait problems and tremors Behavioral/Psych:  negative for abusive relationship, depression Endocrine: negative for temperature intolerance      Blood pressure 127/79, pulse 78, weight 185 lb (83.9 kg), not currently breastfeeding.  Physical Exam Physical Exam General:   alert and no distress  Skin:   no rash or abnormalities  Lungs:   clear to auscultation bilaterally  Heart:   regular rate and rhythm, S1, S2 normal, no murmur,  click, rub or gallop  Breasts:   normal without suspicious masses, skin or nipple changes or axillary nodes  Abdomen:  normal findings: no organomegaly, soft, non-tender and no hernia  Pelvis:  External genitalia: normal general appearance Urinary system: urethral meatus normal and bladder without fullness, nontender Vaginal: normal without tenderness, induration or masses Cervix: normal appearance Adnexa: normal bimanual exam Uterus: anteverted and non-tender, normal size    50% of 15 min visit spent on counseling and coordination of care.   Data Reviewed Labs Wet Prep  Assessment     1. Vaginal discharge Rx: - Cervicovaginal ancillary only( Rudy) - tinidazole (TINDAMAX) 500 MG tablet; Take 2 tablets (1,000 mg total) by mouth daily with breakfast.  Dispense: 10 tablet; Refill: 2  2. Vaginal irritation Rx: - fluconazole (DIFLUCAN) 150 MG tablet; Take 1 tablet (150 mg total) by mouth once for 1 dose.  Dispense: 1 tablet; Refill: 0  3. Routine adult health maintenance Rx: - Prenat-Fe Poly-Methfol-FA-DHA (VITAFOL ULTRA) 29-0.6-0.4-200 MG CAPS; Take 1 tablet by mouth daily before breakfast.  Dispense: 90 capsule; Refill: 4    Plan    Follow up in 2 months  No orders of the defined types were placed in this encounter.  Meds ordered this encounter  Medications  . Prenat-Fe Poly-Methfol-FA-DHA (VITAFOL ULTRA) 29-0.6-0.4-200 MG CAPS    Sig: Take 1 tablet by mouth daily before breakfast.    Dispense:  90 capsule    Refill:  4  . fluconazole (DIFLUCAN) 150 MG tablet    Sig: Take 1 tablet (150 mg total) by mouth once for 1 dose.    Dispense:  1 tablet    Refill:  0  . tinidazole (TINDAMAX) 500 MG tablet    Sig: Take 2 tablets (1,000 mg total) by mouth daily with breakfast.    Dispense:  10 tablet    Refill:  2    Brock Bad, MD 07/17/2019 12:34 PM

## 2019-07-26 ENCOUNTER — Telehealth: Payer: Self-pay

## 2019-07-26 ENCOUNTER — Other Ambulatory Visit: Payer: Self-pay | Admitting: Obstetrics

## 2019-07-26 LAB — CERVICOVAGINAL ANCILLARY ONLY
Bacterial Vaginitis (gardnerella): POSITIVE — AB
Candida Glabrata: NEGATIVE
Candida Vaginitis: NEGATIVE
Chlamydia: POSITIVE — AB
Comment: NEGATIVE
Comment: NEGATIVE
Comment: NEGATIVE
Comment: NEGATIVE
Comment: NEGATIVE
Comment: NORMAL
Neisseria Gonorrhea: NEGATIVE
Trichomonas: NEGATIVE

## 2019-07-26 MED ORDER — AZITHROMYCIN 500 MG PO TABS: ORAL_TABLET | ORAL | 0 refills | Status: DC

## 2019-07-26 NOTE — Telephone Encounter (Signed)
Patient called and made aware that she is positive for chlamydia. Patient made aware we have sent in a script for her that she needs to pick up asap.   Patient made aware that her partner needs to be tested and/or treated and they need to abstain from intercourse.  Patient states understanding. Kathrene Alu RN

## 2019-08-16 ENCOUNTER — Other Ambulatory Visit: Payer: Self-pay

## 2019-08-16 ENCOUNTER — Telehealth: Payer: Self-pay

## 2019-08-16 MED ORDER — AZITHROMYCIN 500 MG PO TABS: ORAL_TABLET | ORAL | 0 refills | Status: DC

## 2019-08-16 NOTE — Telephone Encounter (Signed)
  Called pt (she sent MyChart message) reminded her to Abstain from sexual intercourse until both her and her partner has been treated and rechecked in 6-8 weeks. She verbalized understanding.  Rx resent

## 2019-08-16 NOTE — Progress Notes (Signed)
Rx sent to Pharmacy

## 2019-09-12 ENCOUNTER — Ambulatory Visit (INDEPENDENT_AMBULATORY_CARE_PROVIDER_SITE_OTHER): Payer: Medicaid Other

## 2019-09-12 ENCOUNTER — Other Ambulatory Visit: Payer: Self-pay

## 2019-09-12 VITALS — BP 117/77 | HR 76 | Ht 61.0 in | Wt 193.0 lb

## 2019-09-12 DIAGNOSIS — Z3042 Encounter for surveillance of injectable contraceptive: Secondary | ICD-10-CM | POA: Diagnosis not present

## 2019-09-12 MED ORDER — MEDROXYPROGESTERONE ACETATE 150 MG/ML IM SUSP
150.0000 mg | Freq: Once | INTRAMUSCULAR | Status: AC
  Administered 2019-09-12: 11:00:00 150 mg via INTRAMUSCULAR

## 2019-09-12 NOTE — Progress Notes (Signed)
I reviewed the nurses note and agree with the plan of care.   Zariyah Stephens A, MD 01/06/2018 10:46 AM  

## 2019-09-12 NOTE — Progress Notes (Addendum)
Presents for  DEPO, given in RD, tolerated well.  Next DEPO Feb. 17 - Mar. 12/2019  Administrations This Visit    medroxyPROGESTERone (DEPO-PROVERA) injection 150 mg    Admin Date 09/12/2019 Action Given Dose 150 mg Route Intramuscular Administered By Tamela Oddi, RMA

## 2019-10-16 IMAGING — US US MFM OB FOLLOW-UP
1 series · 14 of 24 positions shown · non-contrast
Comparison: none

[Series 1: us mfm ob follow-up · 14 of 24 slices shown]
[im 1/24]
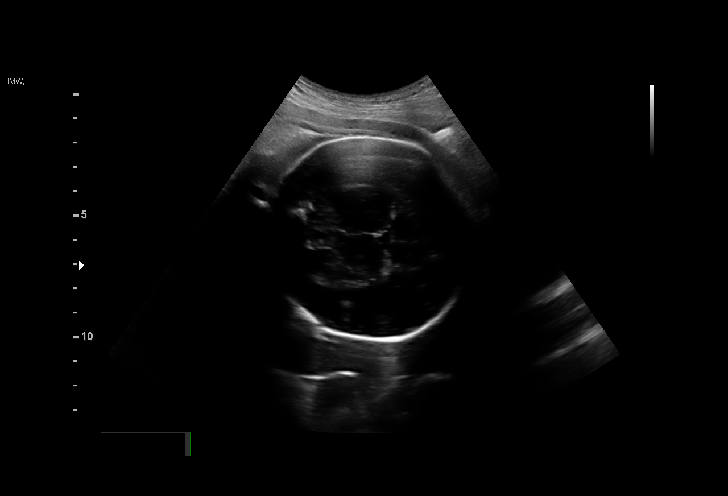
[im 3/24]
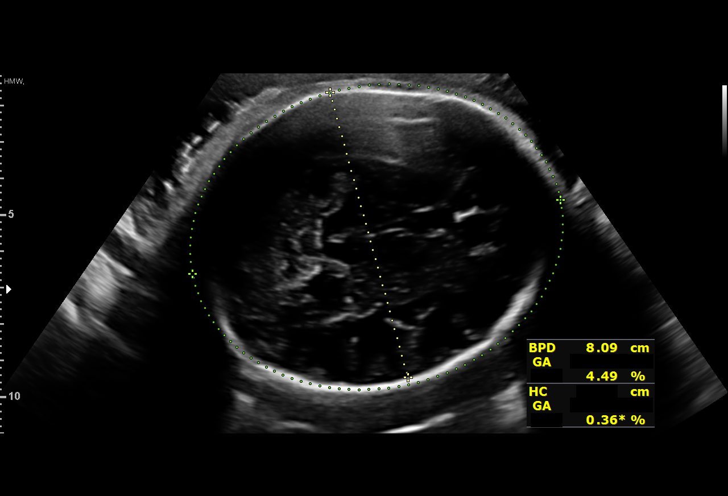
[im 5/24]
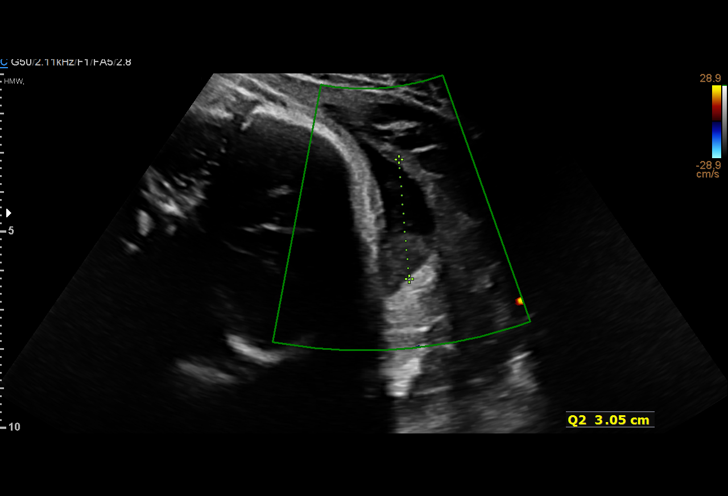
[im 7/24]
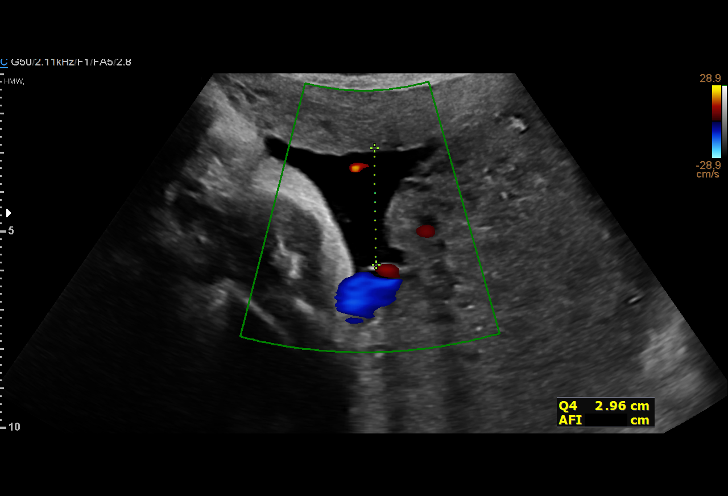
[im 8/24]
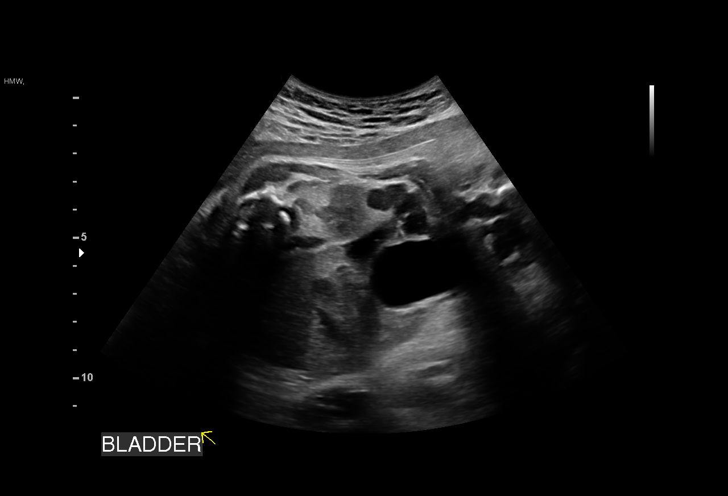
[im 10/24]
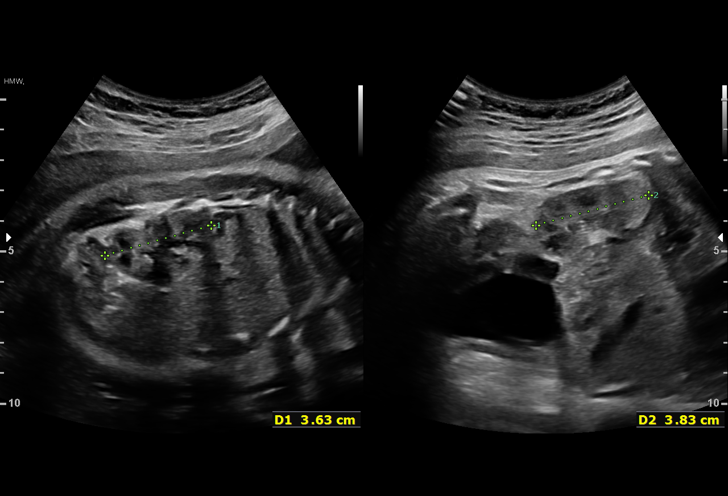
[im 12/24]
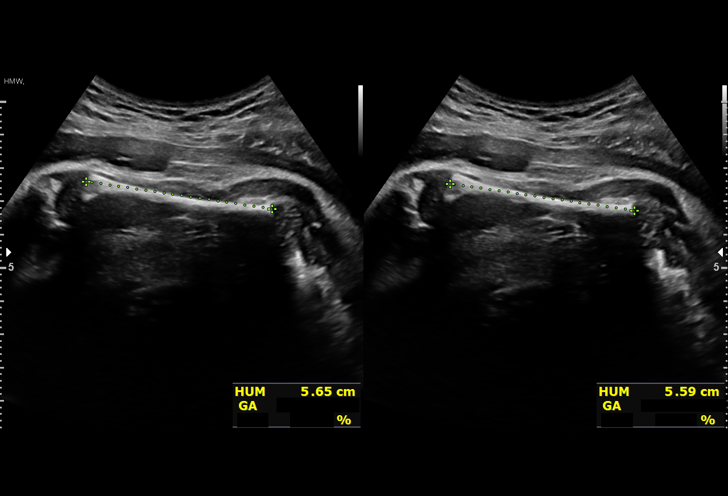
[im 13/24]
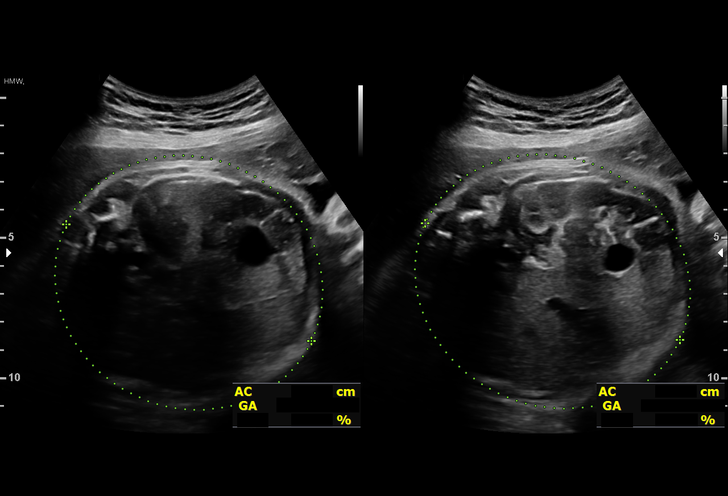
[im 15/24]
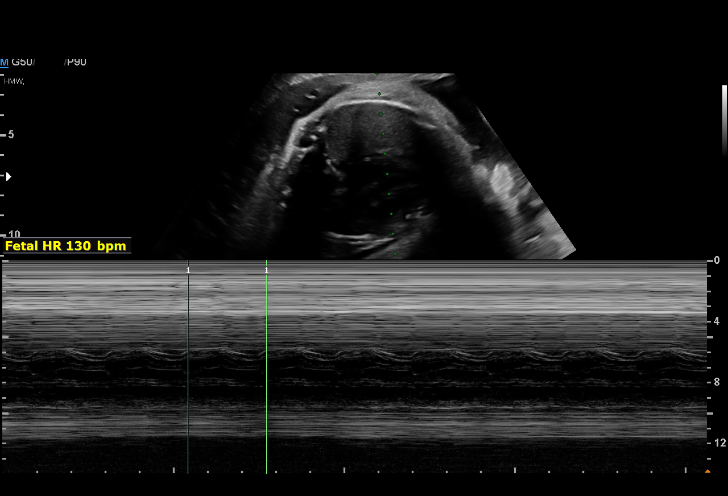
[im 17/24]
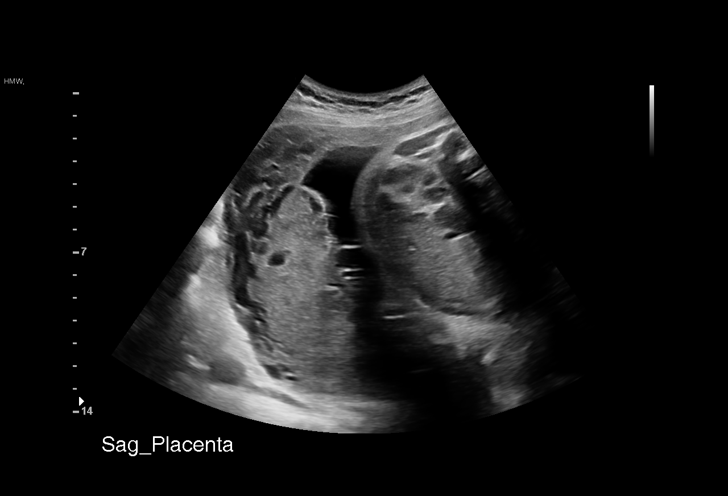
[im 19/24]
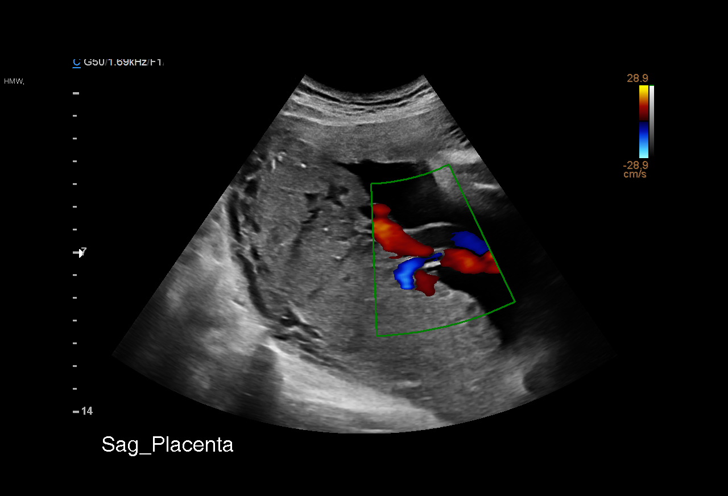
[im 20/24]
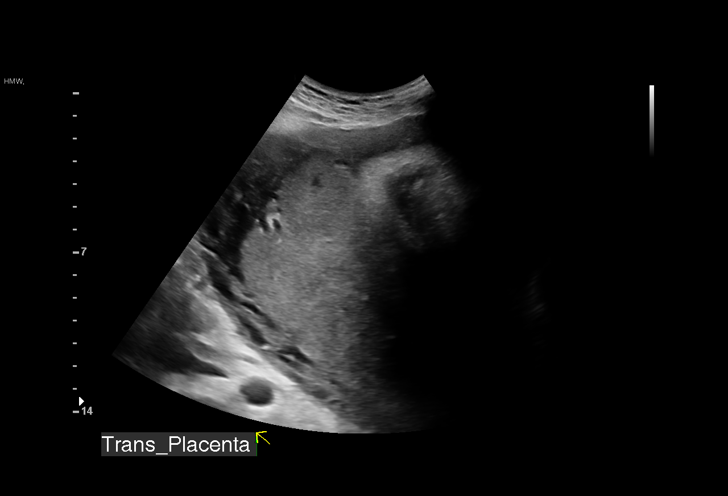
[im 22/24]
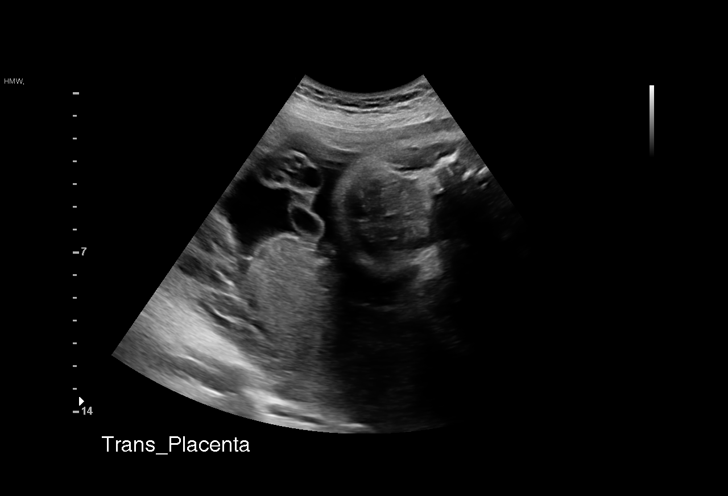
[im 24/24]
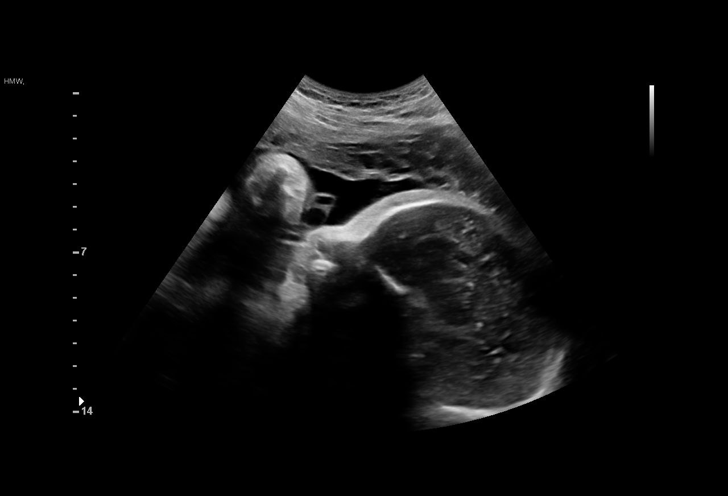

[14 of 24 positions shown; findings below may reference images not displayed]

[REDACTED]care - [HOSPITAL]

 ----------------------------------------------------------------------

 ----------------------------------------------------------------------
Indications

  34 weeks gestation of pregnancy
  Anemia during pregnancy in third trimester
  Encounter for other antenatal screening
  follow-up
 ----------------------------------------------------------------------
Fetal Evaluation

 Num Of Fetuses:          1
 Fetal Heart Rate(bpm):   130
 Cardiac Activity:        Observed
 Presentation:            Cephalic
 Placenta:                Posterior
 P. Cord Insertion:       Previously Visualized

 Amniotic Fluid
 AFI FV:      Within normal limits

 AFI Sum(cm)     %Tile       Largest Pocket(cm)
 11.9            34

 RUQ(cm)       RLQ(cm)       LUQ(cm)        LLQ(cm)

Biometry

 BPD:      80.9  mm     G. Age:  32w 3d          4  %    CI:        76.38   %    70 - 86
                                                         FL/HC:       21.0  %    20.1 -
 HC:      293.3  mm     G. Age:  32w 3d        < 3  %    HC/AC:       1.01       0.93 -
 AC:      291.7  mm     G. Age:  33w 1d         16  %    FL/BPD:      76.0  %    71 - 87
 FL:       61.5  mm     G. Age:  31w 6d        < 3  %    FL/AC:       21.1  %    20 - 24
 HUM:      56.2  mm     G. Age:  32w 5d         22  %

 Est. FW:    5355   gm     4 lb 7 oz     22  %
OB History

 Gravidity:    3         Term:   1
 TOP:          1        Living:  1
Gestational Age

 Clinical EDD:  34w 5d                                        EDD:   12/20/18
 U/S Today:     32w 3d                                        EDD:   01/05/19
 Best:          34w 5d     Det. By:  Clinical EDD             EDD:   12/20/18
Anatomy

 Cranium:               Appears normal         LVOT:                   Appears normal
 Cavum:                 Previously seen        Aortic Arch:            Previously seen
 Ventricles:            Appears normal         Ductal Arch:            Previously seen
 Choroid Plexus:        Previously seen        Diaphragm:              Previously seen
 Cerebellum:            Previously seen        Stomach:                Appears normal, left
                                                                       sided
 Posterior Fossa:       Previously seen        Abdomen:                Previously seen
 Nuchal Fold:           Previously seen        Abdominal Wall:         Previously seen
 Face:                  Orbits and profile     Cord Vessels:           Previously seen
                        previously seen
 Lips:                  Previously seen        Kidneys:                Appear normal
 Palate:                Previously seen        Bladder:                Appears normal
 Thoracic:              Appears normal         Spine:                  Previously seen
 Heart:                 Appears normal         Upper Extremities:      Previously seen
                        (4CH, axis, and
                        situs)
 RVOT:                  Appears normal         Lower Extremities:      Previously seen

 Other:  Female gender Nasal bone visualized previously. Heels and 5th digit
         visualized previously.
Cervix Uterus Adnexa

 Cervix
 Not visualized (advanced GA >21wks)
Impression

 Normal interval growth.
Recommendations

 Follow up as clinically indicated.

## 2019-12-05 ENCOUNTER — Ambulatory Visit (INDEPENDENT_AMBULATORY_CARE_PROVIDER_SITE_OTHER): Payer: Medicaid Other

## 2019-12-05 ENCOUNTER — Other Ambulatory Visit: Payer: Self-pay

## 2019-12-05 VITALS — BP 123/78 | HR 87 | Wt 192.0 lb

## 2019-12-05 DIAGNOSIS — Z3042 Encounter for surveillance of injectable contraceptive: Secondary | ICD-10-CM | POA: Diagnosis not present

## 2019-12-05 MED ORDER — MEDROXYPROGESTERONE ACETATE 150 MG/ML IM SUSP
150.0000 mg | Freq: Once | INTRAMUSCULAR | Status: AC
  Administered 2019-12-05: 150 mg via INTRAMUSCULAR

## 2019-12-05 MED ORDER — MEDROXYPROGESTERONE ACETATE 150 MG/ML IM SUSP: 150.0000 mg | INTRAMUSCULAR | 3 refills | Status: DC

## 2019-12-05 NOTE — Progress Notes (Signed)
Presents for DEPO, [facility] given in LD, tolerated well.  Next DEPO May 12-26, 2021   Administrations This Visit    medroxyPROGESTERone (DEPO-PROVERA) injection 150 mg    Admin Date 12/05/2019 Action Given Dose 150 mg Route Intramuscular Administered By Maretta Bees, RMA

## 2019-12-20 ENCOUNTER — Telehealth: Payer: Self-pay

## 2019-12-20 NOTE — Telephone Encounter (Signed)
Returned call, pt reports vaginal itching and states that it feels like symptoms that she has had in the past, advised to schedule an appt for swab, transferred to scheduler.

## 2019-12-24 ENCOUNTER — Ambulatory Visit: Payer: Medicaid Other | Admitting: Advanced Practice Midwife

## 2020-02-22 ENCOUNTER — Emergency Department (HOSPITAL_COMMUNITY): Payer: Medicaid Other

## 2020-02-22 ENCOUNTER — Encounter (HOSPITAL_COMMUNITY): Payer: Self-pay | Admitting: Emergency Medicine

## 2020-02-22 ENCOUNTER — Emergency Department (HOSPITAL_COMMUNITY)
Admission: EM | Admit: 2020-02-22 | Discharge: 2020-02-22 | Disposition: A | Discharge status: Home / Self Care | Payer: Medicaid Other | Attending: Emergency Medicine | Admitting: Emergency Medicine

## 2020-02-22 ENCOUNTER — Other Ambulatory Visit: Payer: Self-pay

## 2020-02-22 DIAGNOSIS — Z5321 Procedure and treatment not carried out due to patient leaving prior to being seen by health care provider: Secondary | ICD-10-CM | POA: Diagnosis not present

## 2020-02-22 DIAGNOSIS — R079 Chest pain, unspecified: Secondary | ICD-10-CM | POA: Insufficient documentation

## 2020-02-22 LAB — CBC
HCT: 43.7 % (ref 36.0–46.0)
Hemoglobin: 13.6 g/dL (ref 12.0–15.0)
MCH: 27.4 pg (ref 26.0–34.0)
MCHC: 31.1 g/dL (ref 30.0–36.0)
MCV: 87.9 fL (ref 80.0–100.0)
Platelets: 176 10*3/uL (ref 150–400)
RBC: 4.97 MIL/uL (ref 3.87–5.11)
RDW: 14.6 % (ref 11.5–15.5)
WBC: 6.5 10*3/uL (ref 4.0–10.5)
nRBC: 0 % (ref 0.0–0.2)

## 2020-02-22 LAB — BASIC METABOLIC PANEL
Anion gap: 7 (ref 5–15)
BUN: 7 mg/dL (ref 6–20)
CO2: 25 mmol/L (ref 22–32)
Calcium: 9 mg/dL (ref 8.9–10.3)
Chloride: 105 mmol/L (ref 98–111)
Creatinine, Ser: 0.94 mg/dL (ref 0.44–1.00)
GFR calc Af Amer: >60 mL/min (ref >60)
GFR calc non Af Amer: >60 mL/min (ref >60)
Glucose, Bld: 109 mg/dL — ABNORMAL HIGH (ref 70–99)
Potassium: 3.6 mmol/L (ref 3.5–5.1)
Sodium: 137 mmol/L (ref 135–145)

## 2020-02-22 LAB — I-STAT BETA HCG BLOOD, ED (MC, WL, AP ONLY): I-stat hCG, quantitative: <5 m[IU]/mL (ref <5)

## 2020-02-22 LAB — TROPONIN I (HIGH SENSITIVITY)
Troponin I (High Sensitivity): 5 ng/L (ref <18)
Troponin I (High Sensitivity): 2 ng/L (ref <18)

## 2020-02-22 MED ORDER — SODIUM CHLORIDE 0.9% FLUSH: 3.0000 mL | Freq: Once | INTRAVENOUS | Status: DC

## 2020-02-22 NOTE — ED Triage Notes (Signed)
Patient reports central chest pain this evening , denies SOB , no emesis or diaphoresis , patient added occasional dry cough and molds at home .

## 2020-02-22 NOTE — ED Notes (Signed)
Pt stated she had been waiting too long and was leaving.

## 2020-02-27 ENCOUNTER — Ambulatory Visit: Payer: Medicaid Other

## 2020-02-29 ENCOUNTER — Ambulatory Visit: Payer: Medicaid Other

## 2020-03-03 ENCOUNTER — Ambulatory Visit: Payer: Medicaid Other

## 2020-03-05 ENCOUNTER — Other Ambulatory Visit (HOSPITAL_COMMUNITY)
Admission: RE | Admit: 2020-03-05 | Discharge: 2020-03-05 | Disposition: A | Discharge status: Home / Self Care | Payer: Medicaid Other | Source: Ambulatory Visit | Attending: Obstetrics | Admitting: Obstetrics

## 2020-03-05 ENCOUNTER — Ambulatory Visit (HOSPITAL_BASED_OUTPATIENT_CLINIC_OR_DEPARTMENT_OTHER): Payer: Medicaid Other

## 2020-03-05 ENCOUNTER — Other Ambulatory Visit: Payer: Self-pay

## 2020-03-05 VITALS — Ht 61.0 in | Wt 183.0 lb

## 2020-03-05 DIAGNOSIS — Z30013 Encounter for initial prescription of injectable contraceptive: Secondary | ICD-10-CM

## 2020-03-05 DIAGNOSIS — N898 Other specified noninflammatory disorders of vagina: Secondary | ICD-10-CM | POA: Diagnosis not present

## 2020-03-05 MED ORDER — MEDROXYPROGESTERONE ACETATE 150 MG/ML IM SUSP
150.0000 mg | Freq: Once | INTRAMUSCULAR | Status: AC
  Administered 2020-03-05: 150 mg via INTRAMUSCULAR

## 2020-03-05 NOTE — Progress Notes (Signed)
GYN presents for DEPO, given in LD, tolerated well.  Next DEPO August 11-25, 2021  Administrations This Visit    medroxyPROGESTERone (DEPO-PROVERA) injection 150 mg    Admin Date 03/05/2020 Action Given Dose 150 mg Route Intramuscular Administered By Maretta Bees, RMA          SUBJECTIVE:  29 y.o. female complains of white vaginal discharge for 4 day(s). Denies odor, itching, dysuria, abnormal vaginal bleeding or significant pelvic pain or fever. No UTI symptoms. Denies history of known exposure to STD.  No LMP recorded. Patient has had an injection.  OBJECTIVE:  She appears well, afebrile. Urine dipstick: not done.  ASSESSMENT:  Vaginal Discharge   PLAN:  GC, chlamydia, trichomonas, BVAG, CVAG probe sent to lab. Treatment: To be determined once lab results are received ROV prn if symptoms persist or worsen.

## 2020-03-06 ENCOUNTER — Other Ambulatory Visit: Payer: Self-pay | Admitting: Obstetrics & Gynecology

## 2020-03-06 DIAGNOSIS — A749 Chlamydial infection, unspecified: Secondary | ICD-10-CM

## 2020-03-06 DIAGNOSIS — N760 Acute vaginitis: Secondary | ICD-10-CM

## 2020-03-06 LAB — CERVICOVAGINAL ANCILLARY ONLY
Bacterial Vaginitis (gardnerella): POSITIVE — AB
Candida Glabrata: NEGATIVE
Candida Vaginitis: NEGATIVE
Chlamydia: POSITIVE — AB
Comment: NEGATIVE
Comment: NEGATIVE
Comment: NEGATIVE
Comment: NEGATIVE
Comment: NEGATIVE
Comment: NORMAL
Neisseria Gonorrhea: NEGATIVE
Trichomonas: NEGATIVE

## 2020-03-06 MED ORDER — AZITHROMYCIN 500 MG PO TABS: 1000.0000 mg | ORAL_TABLET | Freq: Once | ORAL | 1 refills | Status: DC

## 2020-03-06 MED ORDER — METRONIDAZOLE 500 MG PO TABS: 500.0000 mg | ORAL_TABLET | Freq: Two times a day (BID) | ORAL | 0 refills | Status: DC

## 2020-03-19 NOTE — Progress Notes (Signed)
Patient ID: Barbara Cox, female   DOB: 23-Aug-1991, 29 y.o.   MRN: [id number] Patient was assessed and managed by nursing staff during this encounter. I have reviewed the chart and agree with the documentation and plan. I have also made any necessary editorial changes.  Scheryl Darter, MD 03/19/2020 3:35 PM

## 2020-03-31 ENCOUNTER — Other Ambulatory Visit: Payer: Self-pay

## 2020-03-31 DIAGNOSIS — A749 Chlamydial infection, unspecified: Secondary | ICD-10-CM

## 2020-03-31 DIAGNOSIS — N760 Acute vaginitis: Secondary | ICD-10-CM

## 2020-03-31 MED ORDER — AZITHROMYCIN 500 MG PO TABS: 1000.0000 mg | ORAL_TABLET | Freq: Once | ORAL | 0 refills | Status: DC

## 2020-03-31 MED ORDER — METRONIDAZOLE 500 MG PO TABS: 500.0000 mg | ORAL_TABLET | Freq: Two times a day (BID) | ORAL | 0 refills | Status: DC

## 2020-03-31 NOTE — Progress Notes (Signed)
Pt called reporting that she never received her meds from May. Swab on 03/05/20 was positive for BV and chlamydia.  Spoke with pharmacy and they have no records of meds being sent around that time.  Rx for metronidazole and Azithromycin were both sent to the pharmacy. -EH/RMA

## 2020-04-09 ENCOUNTER — Other Ambulatory Visit: Payer: Self-pay

## 2020-04-09 DIAGNOSIS — A749 Chlamydial infection, unspecified: Secondary | ICD-10-CM

## 2020-04-09 DIAGNOSIS — N760 Acute vaginitis: Secondary | ICD-10-CM

## 2020-04-09 DIAGNOSIS — B9689 Other specified bacterial agents as the cause of diseases classified elsewhere: Secondary | ICD-10-CM

## 2020-04-09 MED ORDER — AZITHROMYCIN 500 MG PO TABS: 1000.0000 mg | ORAL_TABLET | Freq: Once | ORAL | 0 refills | Status: AC

## 2020-04-09 MED ORDER — METRONIDAZOLE 500 MG PO TABS: 500.0000 mg | ORAL_TABLET | Freq: Two times a day (BID) | ORAL | 0 refills | Status: AC

## 2020-04-24 ENCOUNTER — Ambulatory Visit: Payer: Medicaid Other | Admitting: Obstetrics

## 2020-05-27 ENCOUNTER — Other Ambulatory Visit: Payer: Self-pay

## 2020-05-27 ENCOUNTER — Ambulatory Visit (INDEPENDENT_AMBULATORY_CARE_PROVIDER_SITE_OTHER): Payer: Medicaid Other | Admitting: *Deleted

## 2020-05-27 VITALS — Wt 181.0 lb

## 2020-05-27 DIAGNOSIS — Z3042 Encounter for surveillance of injectable contraceptive: Secondary | ICD-10-CM | POA: Diagnosis not present

## 2020-05-27 NOTE — Progress Notes (Signed)
Patient was assessed and managed by nursing staff during this encounter. I have reviewed the chart and agree with the documentation and plan. I have also made any necessary editorial changes.  Warden Fillers, MD 05/27/2020 9:18 AM

## 2020-05-27 NOTE — Progress Notes (Signed)
Pt is in office for Depo injection. Injection given, pt tolerated well.   Pt has no other concerns today.  Pt advised to RTO 11/2-16/2021 for next Depo.   Administrations This Visit    medroxyPROGESTERone (DEPO-PROVERA) injection 150 mg    Admin Date 05/27/2020 Action Given Dose 150 mg Route Intramuscular Administered By Lanney Gins, CMA

## 2020-07-18 ENCOUNTER — Encounter (HOSPITAL_COMMUNITY): Payer: Self-pay | Admitting: Emergency Medicine

## 2020-07-18 ENCOUNTER — Other Ambulatory Visit: Payer: Self-pay

## 2020-07-18 ENCOUNTER — Ambulatory Visit (HOSPITAL_COMMUNITY)
Admission: EM | Admit: 2020-07-18 | Discharge: 2020-07-18 | Disposition: A | Discharge status: Home / Self Care | Payer: Medicaid Other | Attending: Family Medicine | Admitting: Family Medicine

## 2020-07-18 DIAGNOSIS — R109 Unspecified abdominal pain: Secondary | ICD-10-CM

## 2020-07-18 DIAGNOSIS — N760 Acute vaginitis: Secondary | ICD-10-CM | POA: Diagnosis not present

## 2020-07-18 DIAGNOSIS — Z3202 Encounter for pregnancy test, result negative: Secondary | ICD-10-CM

## 2020-07-18 DIAGNOSIS — N730 Acute parametritis and pelvic cellulitis: Secondary | ICD-10-CM

## 2020-07-18 LAB — POCT URINALYSIS DIPSTICK, ED / UC
Glucose, UA: NEGATIVE mg/dL
Hgb urine dipstick: NEGATIVE
Ketones, ur: NEGATIVE mg/dL
Leukocytes,Ua: NEGATIVE
Nitrite: NEGATIVE
Protein, ur: NEGATIVE mg/dL
Specific Gravity, Urine: >=1.03 (ref 1.005–1.030)
Urobilinogen, UA: 1 mg/dL (ref 0.0–1.0)
pH: 5.5 (ref 5.0–8.0)

## 2020-07-18 LAB — POC URINE PREG, ED: Preg Test, Ur: NEGATIVE

## 2020-07-18 MED ORDER — KETOROLAC TROMETHAMINE 60 MG/2ML IM SOLN
60.0000 mg | Freq: Once | INTRAMUSCULAR | Status: AC
  Administered 2020-07-18: 60 mg via INTRAMUSCULAR

## 2020-07-18 MED ORDER — DOXYCYCLINE HYCLATE 100 MG PO CAPS
100.0000 mg | ORAL_CAPSULE | Freq: Two times a day (BID) | ORAL | 0 refills | Status: DC
Start: 2020-07-18 — End: 2021-12-02

## 2020-07-18 MED ORDER — LIDOCAINE HCL (PF) 1 % IJ SOLN
INTRAMUSCULAR | Status: AC
  Filled 2020-07-18: qty 2

## 2020-07-18 MED ORDER — CEFTRIAXONE SODIUM 500 MG IJ SOLR
INTRAMUSCULAR | Status: AC
  Filled 2020-07-18: qty 500

## 2020-07-18 MED ORDER — CEFTRIAXONE SODIUM 500 MG IJ SOLR
500.0000 mg | Freq: Once | INTRAMUSCULAR | Status: AC
  Administered 2020-07-18: 500 mg via INTRAMUSCULAR

## 2020-07-18 MED ORDER — AZITHROMYCIN 250 MG PO TABS
1000.0000 mg | ORAL_TABLET | Freq: Once | ORAL | Status: AC
  Administered 2020-07-18: 1000 mg via ORAL

## 2020-07-18 MED ORDER — KETOROLAC TROMETHAMINE 60 MG/2ML IM SOLN
INTRAMUSCULAR | Status: AC
  Filled 2020-07-18: qty 2

## 2020-07-18 MED ORDER — AZITHROMYCIN 250 MG PO TABS
ORAL_TABLET | ORAL | Status: AC
  Filled 2020-07-18: qty 4

## 2020-07-18 NOTE — ED Provider Notes (Signed)
[facility]    CSN: [id number] Arrival date & time: 07/18/20  3086      History   Chief Complaint Chief Complaint  Patient presents with  . Abdominal Pain  . Vaginitis    HPI Barbara Cox is a 29 y.o. female.   Patient is a 29 year old female presents today with severe lower abdominal cramping, vaginal itching and dysuria x3 days.  Symptoms have been constant.  Pain worse when she moves or walks.  Reporting sexual partner told her he was positive for STDs but not sure which one.  Denies any fever, chills, nausea or vomiting. No LMP recorded. Patient has had an injection.      Past Medical History:  Diagnosis Date  . Anemia   . Thrombocytopenia affecting pregnancy [facility])     Patient Active Problem List   Diagnosis Date Noted  . SVD (spontaneous vaginal delivery) 12/13/2018  . Shoulder dystocia during labor and delivery, delivered 12/13/2018  . Symptomatic anemia 11/16/2018  . Supervision of high risk pregnancy due to social problems 11/16/2018  . Thrombocytopenia affecting pregnancy (HCC) 10/20/2018  . Rh negative status during pregnancy 07/04/2018  . Supervision of other normal pregnancy, antepartum 06/06/2018    Past Surgical History:  Procedure Laterality Date  . NO PAST SURGERIES      OB History    Gravida  3   Para  2   Term  2   Preterm      AB  1   Living  2     SAB      TAB  1   Ectopic      Multiple  0   Live Births  2            Home Medications    Prior to Admission medications   Medication Sig Start Date End Date Taking? Authorizing Provider  acetaminophen (TYLENOL) 325 MG tablet Take 2 tablets (650 mg total) by mouth every 4 (four) hours as needed (for pain scale < 4). Patient not taking: Reported on 01/11/2019 12/14/18   Marylene Land, CNM  doxycycline (VIBRAMYCIN) 100 MG capsule Take 1 capsule (100 mg total) by mouth 2 (two) times daily. 07/18/20   Janace Aris, NP  medroxyPROGESTERone (DEPO-PROVERA)  150 MG/ML injection Inject 1 mL (150 mg total) into the muscle every 3 (three) months. 12/05/19   Brock Bad, MD  Prenat-Fe Poly-Methfol-FA-DHA (VITAFOL ULTRA) 29-0.6-0.4-200 MG CAPS Take 1 tablet by mouth daily before breakfast. 07/17/19   Brock Bad, MD    Family History Family History  Problem Relation Age of Onset  . Leukemia Father   . Breast cancer Sister     Social History Social History   Tobacco Use  . Smoking status: Former Smoker    Quit date: 07/20/2018    Years since quitting: 1.9  . Smokeless tobacco: Never Used  . Tobacco comment: occasional (5 in the past week)  Vaping Use  . Vaping Use: Never used  Substance Use Topics  . Alcohol use: No  . Drug use: Never     Allergies   Peanuts [peanut oil]   Review of Systems Review of Systems   Physical Exam Triage Vital Signs ED Triage Vitals  Enc Vitals Group     BP 07/18/20 0845 112/82     Pulse Rate 07/18/20 0845 70     Resp 07/18/20 0845 17     Temp 07/18/20 0845 98.7 F (37.1 C)     Temp  Source 07/18/20 0845 Oral     SpO2 07/18/20 0845 100 %     Weight --      Height --      Head Circumference --      Peak Flow --      Pain Score 07/18/20 0843 8     Pain Loc --      Pain Edu? --      Excl. in GC? --    No data found.  Updated Vital Signs BP 112/82 (BP Location: Left Arm)   Pulse 70   Temp 98.7 F (37.1 C) (Oral)   Resp 17   SpO2 100%   Visual Acuity Right Eye Distance:   Left Eye Distance:   Bilateral Distance:    Right Eye Near:   Left Eye Near:    Bilateral Near:     Physical Exam Vitals and nursing note reviewed.  Constitutional:      General: She is not in acute distress.    Appearance: Normal appearance. She is not ill-appearing, toxic-appearing or diaphoretic.  HENT:     Head: Normocephalic.     Nose: Nose normal.  Eyes:     Conjunctiva/sclera: Conjunctivae normal.  Pulmonary:     Effort: Pulmonary effort is normal.  Genitourinary:    Comments:  External vaginal exam without any swelling, erythema, lesions or discharge.  Internal vaginal exam with thick, white discharge, mild cervical friability and cervical motion tenderness.  Severe pain with palpation of lower pelvic area and patient crying during exam. Musculoskeletal:        General: Normal range of motion.     Cervical back: Normal range of motion.  Skin:    General: Skin is warm and dry.     Findings: No rash.  Neurological:     Mental Status: She is alert.  Psychiatric:        Mood and Affect: Mood normal.      UC Treatments / Results  Labs (all labs ordered are listed, but only abnormal results are displayed) Labs Reviewed  POCT URINALYSIS DIPSTICK, ED / UC - Abnormal; Notable for the following components:      Result Value   Bilirubin Urine SMALL (*)    All other components within normal limits  POC URINE PREG, ED  CERVICOVAGINAL ANCILLARY ONLY    EKG   Radiology No results found.  Procedures Procedures (including critical care time)  Medications Ordered in UC Medications  ketorolac (TORADOL) injection 60 mg (60 mg Intramuscular Given 07/18/20 0939)  cefTRIAXone (ROCEPHIN) injection 500 mg (500 mg Intramuscular Given 07/18/20 0939)  azithromycin (ZITHROMAX) tablet 1,000 mg (1,000 mg Oral Given 07/18/20 0939)    Initial Impression / Assessment and Plan / UC Course  I have reviewed the triage vital signs and the nursing notes.  Pertinent labs & imaging results that were available during my care of the patient were reviewed by me and considered in my medical decision making (see chart for details).     PID  treating for PID based on exam Swab sent for testing Treating for gonorrhea and chlamydia today.  Sending doxycycline for PID treatment.  Toradol given here for pain.  Ibuprofen at home for pain as needed.  Follow up as needed for continued or worsening symptoms   Final Clinical Impressions(s) / UC Diagnoses   Final diagnoses:  PID  (acute pelvic inflammatory disease)     Discharge Instructions     Treating you for pelvic inflammatory disease. Medication given  here to treat for gonorrhea and chlamydia.  Sending doxycycline to the pharmacy to treat Make sure you finish all medication.  Toradol given here for pain. You can take 600 mg ibuprofen every 8 hours for pain as needed Swab results pending, you can check my chart for results.     ED Prescriptions    Medication Sig Dispense Auth. Provider   doxycycline (VIBRAMYCIN) 100 MG capsule Take 1 capsule (100 mg total) by mouth 2 (two) times daily. 20 capsule Dahlia Byes A, NP     PDMP not reviewed this encounter.   Janace Aris, NP 07/18/20 1134

## 2020-07-18 NOTE — Discharge Instructions (Addendum)
Treating you for pelvic inflammatory disease. Medication given here to treat for gonorrhea and chlamydia.  Sending doxycycline to the pharmacy to treat Make sure you finish all medication.  Toradol given here for pain. You can take 600 mg ibuprofen every 8 hours for pain as needed Swab results pending, you can check my chart for results.

## 2020-07-18 NOTE — ED Triage Notes (Addendum)
Pt presents with lower abdominal pain, vaginal itching, and dysuria xs 3 days. States sexual partner tested positive for STD

## 2020-07-20 LAB — CERVICOVAGINAL ANCILLARY ONLY
Bacterial Vaginitis (gardnerella): NEGATIVE
Candida Glabrata: NEGATIVE
Candida Vaginitis: NEGATIVE
Chlamydia: NEGATIVE
Comment: NEGATIVE
Comment: NEGATIVE
Comment: NEGATIVE
Comment: NEGATIVE
Comment: NEGATIVE
Comment: NORMAL
Neisseria Gonorrhea: NEGATIVE
Trichomonas: NEGATIVE

## 2020-08-15 ENCOUNTER — Ambulatory Visit: Payer: Medicaid Other

## 2020-08-18 ENCOUNTER — Ambulatory Visit (INDEPENDENT_AMBULATORY_CARE_PROVIDER_SITE_OTHER): Payer: Medicaid Other

## 2020-08-18 ENCOUNTER — Other Ambulatory Visit: Payer: Self-pay | Admitting: Certified Nurse Midwife

## 2020-08-18 ENCOUNTER — Other Ambulatory Visit: Payer: Self-pay

## 2020-08-18 VITALS — BP 118/76 | HR 78 | Wt 189.0 lb

## 2020-08-18 DIAGNOSIS — Z3042 Encounter for surveillance of injectable contraceptive: Secondary | ICD-10-CM

## 2020-08-18 MED ORDER — MEDROXYPROGESTERONE ACETATE 150 MG/ML IM SUSP
150.0000 mg | Freq: Once | INTRAMUSCULAR | Status: AC
  Administered 2020-08-18: 150 mg via INTRAMUSCULAR

## 2020-08-18 NOTE — Progress Notes (Signed)
29 y.o GYN presents for DEPO Injection, given in RUOQ, tolerated well.  Next DEPO Jan. 24 -  Feb. 7, 2022  Lat PAP 07/17/2019  Administrations This Visit    medroxyPROGESTERone (DEPO-PROVERA) injection 150 mg    Admin Date 08/18/2020 Action Given Dose 150 mg Route Intramuscular Administered By Maretta Bees, RMA

## 2020-08-18 NOTE — Progress Notes (Signed)
Patient was assessed and managed by nursing staff during this encounter. I have reviewed the chart and agree with the documentation and plan. I have also made any necessary editorial changes.  Catalina Antigua, MD 08/18/2020 10:17 AM

## 2020-09-09 ENCOUNTER — Ambulatory Visit: Payer: Medicaid Other | Admitting: Obstetrics

## 2020-09-19 ENCOUNTER — Ambulatory Visit: Payer: Medicaid Other | Admitting: Obstetrics

## 2020-11-06 ENCOUNTER — Ambulatory Visit: Payer: Medicaid Other

## 2020-11-10 ENCOUNTER — Other Ambulatory Visit: Payer: Self-pay

## 2020-11-10 ENCOUNTER — Ambulatory Visit (INDEPENDENT_AMBULATORY_CARE_PROVIDER_SITE_OTHER): Payer: Medicaid Other

## 2020-11-10 DIAGNOSIS — Z3042 Encounter for surveillance of injectable contraceptive: Secondary | ICD-10-CM

## 2020-11-10 MED ORDER — MEDROXYPROGESTERONE ACETATE 150 MG/ML IM SUSP
150.0000 mg | Freq: Once | INTRAMUSCULAR | Status: AC
  Administered 2020-11-10: 150 mg via INTRAMUSCULAR

## 2020-11-10 NOTE — Progress Notes (Signed)
GYN presents for DEPO Injection, given in RD, tolerated well   Last PAP 07/17/2019.  Needs Annual Exam before next Depo Injection.  Next DEPO due April 18 - Feb 09, 2021  Administrations This Visit    medroxyPROGESTERone (DEPO-PROVERA) injection 150 mg    Admin Date 11/10/2020 Action Given Dose 150 mg Route Intramuscular Administered By Maretta Bees, RMA

## 2020-11-10 NOTE — Progress Notes (Signed)
Patient was assessed and managed by nursing staff during this encounter. I have reviewed the chart and agree with the documentation and plan. I have also made any necessary editorial changes.  Warden Fillers, MD 11/10/2020 8:44 AM

## 2020-11-27 ENCOUNTER — Other Ambulatory Visit: Payer: Self-pay

## 2020-11-27 ENCOUNTER — Encounter (HOSPITAL_COMMUNITY): Payer: Self-pay

## 2020-11-27 ENCOUNTER — Ambulatory Visit (HOSPITAL_COMMUNITY)
Admission: EM | Admit: 2020-11-27 | Discharge: 2020-11-27 | Disposition: A | Discharge status: Home / Self Care | Payer: Medicaid Other | Attending: Family Medicine | Admitting: Family Medicine

## 2020-11-27 DIAGNOSIS — M79605 Pain in left leg: Secondary | ICD-10-CM | POA: Insufficient documentation

## 2020-11-27 LAB — CBC WITH DIFFERENTIAL/PLATELET
Abs Immature Granulocytes: 0.01 10*3/uL (ref 0.00–0.07)
Basophils Absolute: 0 10*3/uL (ref 0.0–0.1)
Basophils Relative: 1 %
Eosinophils Absolute: 0.1 10*3/uL (ref 0.0–0.5)
Eosinophils Relative: 2 %
HCT: 42.4 % (ref 36.0–46.0)
Hemoglobin: 14 g/dL (ref 12.0–15.0)
Immature Granulocytes: 0 %
Lymphocytes Relative: 49 %
Lymphs Abs: 2.3 10*3/uL (ref 0.7–4.0)
MCH: 29 pg (ref 26.0–34.0)
MCHC: 33 g/dL (ref 30.0–36.0)
MCV: 88 fL (ref 80.0–100.0)
Monocytes Absolute: 0.5 10*3/uL (ref 0.1–1.0)
Monocytes Relative: 11 %
Neutro Abs: 1.8 10*3/uL (ref 1.7–7.7)
Neutrophils Relative %: 37 %
Platelets: 149 10*3/uL — ABNORMAL LOW (ref 150–400)
RBC: 4.82 MIL/uL (ref 3.87–5.11)
RDW: 13.2 % (ref 11.5–15.5)
WBC: 4.8 10*3/uL (ref 4.0–10.5)
nRBC: 0 % (ref 0.0–0.2)

## 2020-11-27 MED ORDER — KETOROLAC TROMETHAMINE 60 MG/2ML IM SOLN
60.0000 mg | Freq: Once | INTRAMUSCULAR | Status: AC
  Administered 2020-11-27: 60 mg via INTRAMUSCULAR

## 2020-11-27 MED ORDER — PREDNISONE 20 MG PO TABS
40.0000 mg | ORAL_TABLET | Freq: Every day | ORAL | 0 refills | Status: DC
Start: 2020-11-27 — End: 2021-12-02

## 2020-11-27 MED ORDER — KETOROLAC TROMETHAMINE 60 MG/2ML IM SOLN
INTRAMUSCULAR | Status: AC
  Filled 2020-11-27: qty 2

## 2020-11-27 NOTE — ED Triage Notes (Signed)
Pt presents with left leg pain and swelling. Pt states it has been swelling x 4 days and states the swelling has gotten worse today. Pt states she does not have sensation on the bottom of her foot. Pt states she has taken Tylenol. Pt states she feels her whole leg is burning.

## 2020-12-01 NOTE — ED Provider Notes (Addendum)
[facility]    CSN: [id number] Arrival date & time: 11/27/20  1701      History   Chief Complaint Chief Complaint  Patient presents with  . Leg Pain    HPI Barbara Cox is a 30 y.o. female.   Here today with 4 day history of progressively worsening pain and swelling above left knee. States now pain is radiating down leg to bottom of foot and causing a burning sensation there. No known injury, walking and movement do make worse. Denies fever, chills, redness, lesions, history of knee injury. Taking tylenol without benefit.      Past Medical History:  Diagnosis Date  . Anemia   . Thrombocytopenia affecting pregnancy [facility])     Patient Active Problem List   Diagnosis Date Noted  . SVD (spontaneous vaginal delivery) 12/13/2018  . Shoulder dystocia during labor and delivery, delivered 12/13/2018  . Symptomatic anemia 11/16/2018  . Supervision of high risk pregnancy due to social problems 11/16/2018  . Thrombocytopenia affecting pregnancy (HCC) 10/20/2018  . Rh negative status during pregnancy 07/04/2018  . Supervision of other normal pregnancy, antepartum 06/06/2018    Past Surgical History:  Procedure Laterality Date  . NO PAST SURGERIES      OB History    Gravida  3   Para  2   Term  2   Preterm      AB  1   Living  2     SAB      IAB  1   Ectopic      Multiple  0   Live Births  2            Home Medications    Prior to Admission medications   Medication Sig Start Date End Date Taking? Authorizing Provider  predniSONE (DELTASONE) 20 MG tablet Take 2 tablets (40 mg total) by mouth daily with breakfast. 11/27/20  Yes Particia Nearing, PA-C  acetaminophen (TYLENOL) 325 MG tablet Take 2 tablets (650 mg total) by mouth every 4 (four) hours as needed (for pain scale < 4). Patient not taking: Reported on 01/11/2019 12/14/18   Marylene Land, CNM  doxycycline (VIBRAMYCIN) 100 MG capsule Take 1 capsule (100 mg total) by  mouth 2 (two) times daily. 07/18/20   Janace Aris, NP  medroxyPROGESTERone (DEPO-PROVERA) 150 MG/ML injection Inject 1 mL (150 mg total) into the muscle every 3 (three) months. 12/05/19   Brock Bad, MD  Prenat-Fe Poly-Methfol-FA-DHA (VITAFOL ULTRA) 29-0.6-0.4-200 MG CAPS Take 1 tablet by mouth daily before breakfast. 07/17/19   Brock Bad, MD    Family History Family History  Problem Relation Age of Onset  . Leukemia Father   . Breast cancer Sister     Social History Social History   Tobacco Use  . Smoking status: Former Smoker    Quit date: 07/20/2018    Years since quitting: 2.3  . Smokeless tobacco: Never Used  . Tobacco comment: occasional (5 in the past week)  Vaping Use  . Vaping Use: Never used  Substance Use Topics  . Alcohol use: No  . Drug use: Never     Allergies   Peanuts [peanut oil]   Review of Systems Review of Systems PER HPI   Physical Exam Triage Vital Signs ED Triage Vitals  Enc Vitals Group     BP 11/27/20 1739 111/78     Pulse Rate 11/27/20 1739 84     Resp 11/27/20 1739 17  Temp 11/27/20 1739 97.9 F (36.6 C)     Temp Source 11/27/20 1739 Oral     SpO2 11/27/20 1739 99 %     Weight --      Height --      Head Circumference --      Peak Flow --      Pain Score 11/27/20 1732 10     Pain Loc --      Pain Edu? --      Excl. in GC? --    No data found.  Updated Vital Signs BP 111/78 (BP Location: Right Arm)   Pulse 84   Temp 97.9 F (36.6 C) (Oral)   Resp 17   SpO2 99%   Visual Acuity Right Eye Distance:   Left Eye Distance:   Bilateral Distance:    Right Eye Near:   Left Eye Near:    Bilateral Near:     Physical Exam Vitals and nursing note reviewed.  Constitutional:      Appearance: Normal appearance. She is not ill-appearing.  HENT:     Head: Atraumatic.  Eyes:     Extraocular Movements: Extraocular movements intact.     Conjunctiva/sclera: Conjunctivae normal.  Cardiovascular:     Rate and  Rhythm: Normal rate and regular rhythm.     Heart sounds: Normal heart sounds.  Pulmonary:     Effort: Pulmonary effort is normal.     Breath sounds: Normal breath sounds.  Musculoskeletal:        General: Tenderness (significant ttp and palpable localized edema superior and lateral to patella left leg) present. Normal range of motion.     Cervical back: Normal range of motion and neck supple.     Comments: ROM at left knee intact but significantly painful  Skin:    General: Skin is warm and dry.     Findings: No bruising, erythema, lesion or rash.  Neurological:     Mental Status: She is alert and oriented to person, place, and time.  Psychiatric:        Thought Content: Thought content normal.        Judgment: Judgment normal.     Comments: tearful and anxious due to pain     UC Treatments / Results  Labs (all labs ordered are listed, but only abnormal results are displayed) Labs Reviewed  CBC WITH DIFFERENTIAL/PLATELET - Abnormal; Notable for the following components:      Result Value   Platelets 149 (*)    All other components within normal limits    EKG   Radiology No results found.  Procedures Procedures (including critical care time)  Medications Ordered in UC Medications  ketorolac (TORADOL) injection 60 mg (60 mg Intramuscular Given 11/27/20 1822)    Initial Impression / Assessment and Plan / UC Course  I have reviewed the triage vital signs and the nursing notes.  Pertinent labs & imaging results that were available during my care of the patient were reviewed by me and considered in my medical decision making (see chart for details).    Afebrile, no trauma/injury to area, and not appearing to have direct bony involvement on exam. Consulted Dr. Jordan Likes with Sports Medicine on site who suspects synovitis, will get CBC, start prednisone burst and f/u in office in next few days for ultrasound. IM toradol given today for pain in clinic. She is requesting  crutches to help with mobility. Discussed this may worsen her fall risk but she is firmly requesting. These  were given. Work note given, return for acutely worsening sxs in meantime.    Final Clinical Impressions(s) / UC Diagnoses   Final diagnoses:  Left leg pain   Discharge Instructions   None    ED Prescriptions    Medication Sig Dispense Auth. Provider   predniSONE (DELTASONE) 20 MG tablet Take 2 tablets (40 mg total) by mouth daily with breakfast. 10 tablet Particia Nearing, [place]     PDMP not reviewed this encounter.   Particia Nearing, [place] 12/01/20 1130    Roosvelt Maser [employer], [place] 12/01/20 1131

## 2020-12-08 ENCOUNTER — Ambulatory Visit: Payer: Medicaid Other | Admitting: Certified Nurse Midwife

## 2020-12-25 ENCOUNTER — Ambulatory Visit: Payer: Medicaid Other | Admitting: Obstetrics

## 2021-01-13 ENCOUNTER — Ambulatory Visit (INDEPENDENT_AMBULATORY_CARE_PROVIDER_SITE_OTHER): Payer: Medicaid Other | Admitting: Obstetrics

## 2021-01-13 ENCOUNTER — Encounter: Payer: Self-pay | Admitting: Obstetrics

## 2021-01-13 ENCOUNTER — Ambulatory Visit: Payer: Medicaid Other | Admitting: Obstetrics

## 2021-01-13 ENCOUNTER — Other Ambulatory Visit: Payer: Self-pay

## 2021-01-13 ENCOUNTER — Other Ambulatory Visit (HOSPITAL_COMMUNITY)
Admission: RE | Admit: 2021-01-13 | Discharge: 2021-01-13 | Disposition: A | Discharge status: Home / Self Care | Payer: Medicaid Other | Source: Ambulatory Visit | Attending: Obstetrics | Admitting: Obstetrics

## 2021-01-13 VITALS — BP 120/80 | HR 89 | Ht 62.0 in | Wt 190.0 lb

## 2021-01-13 DIAGNOSIS — Z1501 Genetic susceptibility to malignant neoplasm of breast: Secondary | ICD-10-CM

## 2021-01-13 DIAGNOSIS — Z3042 Encounter for surveillance of injectable contraceptive: Secondary | ICD-10-CM

## 2021-01-13 DIAGNOSIS — Z113 Encounter for screening for infections with a predominantly sexual mode of transmission: Secondary | ICD-10-CM

## 2021-01-13 DIAGNOSIS — N898 Other specified noninflammatory disorders of vagina: Secondary | ICD-10-CM | POA: Diagnosis not present

## 2021-01-13 DIAGNOSIS — I83892 Varicose veins of left lower extremities with other complications: Secondary | ICD-10-CM | POA: Diagnosis not present

## 2021-01-13 DIAGNOSIS — Z124 Encounter for screening for malignant neoplasm of cervix: Secondary | ICD-10-CM | POA: Insufficient documentation

## 2021-01-13 DIAGNOSIS — Z01419 Encounter for gynecological examination (general) (routine) without abnormal findings: Secondary | ICD-10-CM | POA: Diagnosis not present

## 2021-01-13 MED ORDER — MEDROXYPROGESTERONE ACETATE 150 MG/ML IM SUSP: 150.0000 mg | INTRAMUSCULAR | 3 refills | Status: DC

## 2021-01-13 NOTE — Progress Notes (Signed)
Patient presents for Annual Exam.  LMP: pt not having cycles with Depo.  Last pap: 06/06/2018 WNL   Contraception: Depo  STD Screening:pt made aware ins may not cover labs at 100%. Pt voiced understanding.   Family Hx of Breast Cancer: Sister passed away at 28 yrs old 3-4 yrs ago per pt  CC: constant swelling esp in "knees" and legs on left side.

## 2021-01-13 NOTE — Progress Notes (Signed)
Subjective:        Barbara Cox is a 30 y.o. female here for a routine exam.  Current complaints: Vaginal discharge.  Swelling of lower left leg when on feet for prolonged periods of time.    Personal health questionnaire:  Is patient Ashkenazi Jewish, have a family history of breast and/or ovarian cancer: yes Is there a family history of uterine cancer diagnosed at age < 12, gastrointestinal cancer, urinary tract cancer, family member who is a Personnel officer syndrome-associated carrier: no Is the patient overweight and hypertensive, family history of diabetes, personal history of gestational diabetes, preeclampsia or PCOS: no Is patient over 47, have PCOS,  family history of premature CHD under age 68, diabetes, smoke, have hypertension or peripheral artery disease:  no At any time, has a partner hit, kicked or otherwise hurt or frightened you?: no Over the past 2 weeks, have you felt down, depressed or hopeless?: no Over the past 2 weeks, have you felt little interest or pleasure in doing things?:no   Gynecologic History No LMP recorded. Patient has had an injection. Contraception: Depo-Provera injections Last Pap: 06-06-2018. Results were: normal Last mammogram: none. Results were: none  Obstetric History OB History  Gravida Para Term Preterm AB Living  3 2 2   1 2   SAB IAB Ectopic Multiple Live Births    1   0 2    # Outcome Date GA Lbr Len/2nd Weight Sex Delivery Anes PTL Lv  3 Term 12/13/18 [redacted]w[redacted]d 05:49 / 00:02 8 lb 1.1 oz (3.66 kg) F Vag-Spont EPI  LIV  2 IAB 2018 [redacted]w[redacted]d         1 Term 11/09/11   9 lb (4.082 kg) M Vag-Spont   LIV    Past Medical History:  Diagnosis Date  . Anemia   . Thrombocytopenia affecting pregnancy [facility])     Past Surgical History:  Procedure Laterality Date  . NO PAST SURGERIES       Current Outpatient Medications:  .  acetaminophen (TYLENOL) 325 MG tablet, Take 2 tablets (650 mg total) by mouth every 4 (four) hours as needed (for pain scale < 4).  (Patient not taking: No sig reported), Disp: 30 tablet, Rfl: 1 .  doxycycline (VIBRAMYCIN) 100 MG capsule, Take 1 capsule (100 mg total) by mouth 2 (two) times daily., Disp: 20 capsule, Rfl: 0 .  medroxyPROGESTERone (DEPO-PROVERA) 150 MG/ML injection, Inject 1 mL (150 mg total) into the muscle every 3 (three) months., Disp: 1 mL, Rfl: 3 .  predniSONE (DELTASONE) 20 MG tablet, Take 2 tablets (40 mg total) by mouth daily with breakfast. (Patient not taking: Reported on 01/13/2021), Disp: 10 tablet, Rfl: 0 .  Prenat-Fe Poly-Methfol-FA-DHA (VITAFOL ULTRA) 29-0.6-0.4-200 MG CAPS, Take 1 tablet by mouth daily before breakfast. (Patient not taking: Reported on 01/13/2021), Disp: 90 capsule, Rfl: 4  Current Facility-Administered Medications:  .  medroxyPROGESTERone (DEPO-PROVERA) injection 150 mg, 150 mg, Intramuscular, Q90 days, 03/15/2021, CNM, 150 mg at 05/27/20 0901 Allergies  Allergen Reactions  . Peanuts [Peanut Oil] Anaphylaxis    Social History   Tobacco Use  . Smoking status: Former Smoker    Quit date: 07/20/2018    Years since quitting: 2.4  . Smokeless tobacco: Never Used  . Tobacco comment: occasional (5 in the past week)  Substance Use Topics  . Alcohol use: No    Family History  Problem Relation Age of Onset  . Leukemia Father   . Breast cancer Sister  Review of Systems  Constitutional: negative for fatigue and weight loss Respiratory: negative for cough and wheezing Cardiovascular: negative for chest pain, fatigue and palpitations Gastrointestinal: negative for abdominal pain and change in bowel habits Musculoskeletal:positive for myalgias and swelling of lower eft left Neurological: negative for gait problems and tremors Behavioral/Psych: negative for abusive relationship, depression Endocrine: negative for temperature intolerance    Genitourinary:negative for abnormal menstrual periods, genital lesions, hot flashes, sexual problems.  Positive for vaginal  discharge Integument/breast: negative for breast lump, breast tenderness, nipple discharge and skin lesion(s)    Objective:       BP 120/80   Pulse 89   Ht 5\' 2"  (1.575 m)   Wt 190 lb (86.2 kg)   BMI 34.75 kg/m  General:   alert and no distress  Skin:   no rash or abnormalities  Lungs:   clear to auscultation bilaterally  Heart:   regular rate and rhythm, S1, S2 normal, no murmur, click, rub or gallop  Breasts:   normal without suspicious masses, skin or nipple changes or axillary nodes  Abdomen:  normal findings: no organomegaly, soft, non-tender and no hernia  Pelvis:  External genitalia: normal general appearance Urinary system: urethral meatus normal and bladder without fullness, nontender Vaginal: normal without tenderness, induration or masses Cervix: normal appearance Adnexa: normal bimanual exam Uterus: anteverted and non-tender, normal size   Lab Review Urine pregnancy test Labs reviewed yes Radiologic studies reviewed no  I have spent a total of 20 minutes of face-to-face time, excluding clinical staff time, reviewing notes and preparing to see patient, ordering tests and/or medications, and counseling the patient.  Assessment:     1. Encounter for annual routine gynecological examination - doing well  2. Screening for cervical cancer Rx: - Cytology - PAP  3. Vaginal discharge Rx: - Cervicovaginal ancillary only( [place])  4. Screening for STD (sexually transmitted disease) Rx: - HIV Antibody (routine testing w rflx) - RPR - Hepatitis C antibody - Hepatitis B surface antigen  5. Breast cancer genetic susceptibility - sister died at age 61 of Breast CA Rx: - MM Digital Screening; Future  6. Varicose veins of leg with swelling, left Rx: - Ambulatory referral to Orthopedics  7. Encounter for surveillance of injectable contraceptive Rx: - medroxyPROGESTERone (DEPO-PROVERA) 150 MG/ML injection; Inject 1 mL (150 mg total) into the muscle every 3  (three) months.  Dispense: 1 mL; Refill: 3    Plan:    Education reviewed: calcium supplements, depression evaluation, low fat, low cholesterol diet, safe sex/STD prevention, self breast exams, skin cancer screening and weight bearing exercise. Contraception: Depo-Provera injections. Mammogram ordered. Follow up in: 1 year.   Meds ordered this encounter  Medications  . medroxyPROGESTERone (DEPO-PROVERA) 150 MG/ML injection    Sig: Inject 1 mL (150 mg total) into the muscle every 3 (three) months.    Dispense:  1 mL    Refill:  3   Orders Placed This Encounter  Procedures  . MM Digital Screening    Standing Status:   Future    Standing Expiration Date:   01/13/2022    Order Specific Question:   Reason for Exam (SYMPTOM  OR DIAGNOSIS REQUIRED)    Answer:   Genetic susceptibility to Breast Cancer.  Sister died of Breast CA at age 20.    Order Specific Question:   Is the patient pregnant?    Answer:   No    Order Specific Question:   Preferred imaging location?  Answer:   [facility]  . HIV Antibody (routine testing w rflx)  . RPR  . Hepatitis C antibody  . Hepatitis B surface antigen  . Ambulatory referral to Orthopedics    Referral Priority:   Routine    Referral Type:   Consultation    Number of Visits Requested:   1    Brock Bad, MD 01/13/2021 5:14 PM

## 2021-01-14 LAB — HIV ANTIBODY (ROUTINE TESTING W REFLEX): HIV Screen 4th Generation wRfx: NONREACTIVE

## 2021-01-14 LAB — HEPATITIS B SURFACE ANTIGEN: Hepatitis B Surface Ag: NEGATIVE

## 2021-01-14 LAB — HEPATITIS C ANTIBODY: Hep C Virus Ab: <0.1 s/co ratio (ref 0.0–0.9)

## 2021-01-14 LAB — RPR: RPR Ser Ql: NONREACTIVE

## 2021-01-15 ENCOUNTER — Ambulatory Visit: Payer: Medicaid Other | Admitting: Orthopedic Surgery

## 2021-01-15 LAB — CYTOLOGY - PAP: Diagnosis: NEGATIVE

## 2021-01-15 LAB — CERVICOVAGINAL ANCILLARY ONLY
Bacterial Vaginitis (gardnerella): NEGATIVE
Candida Glabrata: NEGATIVE
Candida Vaginitis: NEGATIVE
Chlamydia: NEGATIVE
Comment: NEGATIVE
Comment: NEGATIVE
Comment: NEGATIVE
Comment: NEGATIVE
Comment: NEGATIVE
Comment: NORMAL
Neisseria Gonorrhea: NEGATIVE
Trichomonas: NEGATIVE

## 2021-01-20 ENCOUNTER — Ambulatory Visit: Payer: Medicaid Other | Admitting: Orthopedic Surgery

## 2021-01-30 ENCOUNTER — Ambulatory Visit: Payer: Medicaid Other

## 2021-02-03 ENCOUNTER — Ambulatory Visit: Payer: Medicaid Other

## 2021-02-25 ENCOUNTER — Ambulatory Visit (INDEPENDENT_AMBULATORY_CARE_PROVIDER_SITE_OTHER): Payer: Medicaid Other

## 2021-02-25 ENCOUNTER — Other Ambulatory Visit: Payer: Self-pay

## 2021-02-25 VITALS — BP 112/78 | HR 72

## 2021-02-25 DIAGNOSIS — Z3042 Encounter for surveillance of injectable contraceptive: Secondary | ICD-10-CM | POA: Diagnosis not present

## 2021-02-25 LAB — POCT URINE PREGNANCY: Preg Test, Ur: NEGATIVE

## 2021-02-25 MED ORDER — MEDROXYPROGESTERONE ACETATE 150 MG/ML IM SUSP
150.0000 mg | Freq: Once | INTRAMUSCULAR | Status: AC
  Administered 2021-02-25: 150 mg via INTRAMUSCULAR

## 2021-02-25 NOTE — Progress Notes (Addendum)
Patient is here for her depo-provera restart. Given in RA IM [phone] Side Effects: none  HCG Serum: negative Follow up UPT: At home test in three weeks She miss the two-week window for her depo-provera.  Next depo: August 2022 Patient reported no sexual intercourse for 4-5 months and request to restart depo-provera today to regulate her cycle. Ok per provider to restart today. Patient verbalized understanding at this time.

## 2021-02-25 NOTE — Addendum Note (Signed)
Addended by: Cheree Ditto, Doreather Hoxworth A on: 02/25/2021 10:30 AM   Modules accepted: Orders

## 2021-04-16 ENCOUNTER — Ambulatory Visit: Payer: Medicaid Other | Admitting: Obstetrics & Gynecology

## 2021-05-18 ENCOUNTER — Other Ambulatory Visit: Payer: Self-pay

## 2021-05-18 ENCOUNTER — Ambulatory Visit: Payer: Medicaid Other

## 2021-05-18 VITALS — BP 116/73 | HR 84 | Ht 62.0 in | Wt 203.0 lb

## 2021-05-18 MED ORDER — MEDROXYPROGESTERONE ACETATE 150 MG/ML IM SUSP
150.0000 mg | Freq: Once | INTRAMUSCULAR | Status: AC
  Administered 2021-05-21: 150 mg via INTRAMUSCULAR

## 2021-05-19 ENCOUNTER — Ambulatory Visit: Payer: Medicaid Other

## 2021-05-21 ENCOUNTER — Ambulatory Visit (INDEPENDENT_AMBULATORY_CARE_PROVIDER_SITE_OTHER): Payer: Medicaid Other | Admitting: Obstetrics

## 2021-05-21 ENCOUNTER — Other Ambulatory Visit: Payer: Self-pay

## 2021-05-21 ENCOUNTER — Encounter: Payer: Self-pay | Admitting: Obstetrics

## 2021-05-21 ENCOUNTER — Other Ambulatory Visit: Payer: Self-pay | Admitting: Obstetrics

## 2021-05-21 VITALS — BP 132/72 | HR 89 | Ht 62.0 in | Wt 198.2 lb

## 2021-05-21 DIAGNOSIS — N644 Mastodynia: Secondary | ICD-10-CM | POA: Diagnosis not present

## 2021-05-21 DIAGNOSIS — Z1501 Genetic susceptibility to malignant neoplasm of breast: Secondary | ICD-10-CM | POA: Diagnosis not present

## 2021-05-21 DIAGNOSIS — Z3042 Encounter for surveillance of injectable contraceptive: Secondary | ICD-10-CM

## 2021-05-21 DIAGNOSIS — E669 Obesity, unspecified: Secondary | ICD-10-CM

## 2021-05-21 DIAGNOSIS — Z3009 Encounter for other general counseling and advice on contraception: Secondary | ICD-10-CM | POA: Diagnosis not present

## 2021-05-21 NOTE — Progress Notes (Addendum)
Subjective:    Barbara Cox is a 30 y.o. female who presents for contraception counseling. The patient has complaints of bilateral breast pain today. The patient is sexually active. Pertinent past medical history: none.  The information documented in the HPI was reviewed and verified.  Menstrual History: OB History     Gravida  3   Para  2   Term  2   Preterm      AB  1   Living  2      SAB      IAB  1   Ectopic      Multiple  0   Live Births  2            No LMP recorded. Patient has had an injection.   Patient Active Problem List   Diagnosis Date Noted   SVD (spontaneous vaginal delivery) 12/13/2018   Shoulder dystocia during labor and delivery, delivered 12/13/2018   Symptomatic anemia 11/16/2018   Supervision of high risk pregnancy due to social problems 11/16/2018   Thrombocytopenia affecting pregnancy (HCC) 10/20/2018   Rh negative status during pregnancy 07/04/2018   Supervision of other normal pregnancy, antepartum 06/06/2018   Past Medical History:  Diagnosis Date   Anemia    Thrombocytopenia affecting pregnancy (HCC)     Past Surgical History:  Procedure Laterality Date   NO PAST SURGERIES       Current Outpatient Medications:    medroxyPROGESTERone (DEPO-PROVERA) 150 MG/ML injection, Inject 1 mL (150 mg total) into the muscle every 3 (three) months., Disp: 1 mL, Rfl: 3   acetaminophen (TYLENOL) 325 MG tablet, Take 2 tablets (650 mg total) by mouth every 4 (four) hours as needed (for pain scale < 4). (Patient not taking: No sig reported), Disp: 30 tablet, Rfl: 1   doxycycline (VIBRAMYCIN) 100 MG capsule, Take 1 capsule (100 mg total) by mouth 2 (two) times daily., Disp: 20 capsule, Rfl: 0   predniSONE (DELTASONE) 20 MG tablet, Take 2 tablets (40 mg total) by mouth daily with breakfast. (Patient not taking: No sig reported), Disp: 10 tablet, Rfl: 0   Prenat-Fe Poly-Methfol-FA-DHA (VITAFOL ULTRA) 29-0.6-0.4-200 MG CAPS, Take 1 tablet by mouth  daily before breakfast. (Patient not taking: No sig reported), Disp: 90 capsule, Rfl: 4  Current Facility-Administered Medications:    medroxyPROGESTERone (DEPO-PROVERA) injection 150 mg, 150 mg, Intramuscular, Q90 days, Rogers, Veronica C, CNM, 150 mg at 05/27/20 0901 Allergies  Allergen Reactions   Peanuts [Peanut Oil] Anaphylaxis    Social History   Tobacco Use   Smoking status: Former    Types: Cigarettes    Quit date: 07/20/2018    Years since quitting: 2.8   Smokeless tobacco: Never   Tobacco comments:    occasional (5 in the past week)  Substance Use Topics   Alcohol use: No    Family History  Problem Relation Age of Onset   Leukemia Father    Breast cancer Sister        Review of Systems Constitutional: negative for weight loss Genitourinary:negative for abnormal menstrual periods and vaginal discharge   Objective:   BP 132/72   Pulse 89   Ht 5\' 2"  (1.575 m)   Wt 198 lb 3.2 oz (89.9 kg)   BMI 36.25 kg/m    General:   Alert and no distress  Skin:   no rash or abnormalities  Lungs:   clear to auscultation bilaterally  Heart:   regular rate and rhythm, S1, S2 normal, no  murmur, click, rub or gallop  The remainder of the physical exam deferred due to the type of encounter  Lab Review Urine pregnancy test Labs reviewed yes Radiologic studies reviewed no  I have spent a total of 15 minutes of face-to-face time, excluding clinical staff time, reviewing notes and preparing to see patient, ordering tests and/or medications, and counseling the patient.   Assessment:    30 y.o., continuing Depo-Provera injections, no contraindications.   Plan:   1. Encounter for other general counseling and advice on contraception - discussed options and she decided to continue Depo Provera  2. Bilateral mastodynia    3. Breast cancer genetic susceptibility Rx: - BRCAssure Comprehensive Panel; Future - MM Digital Diagnostic Bilat; Future - BRCAssure Comprehensive  Panel  4. Obesity (BMI 35.0-39.9 without comorbidity) - weight reduction with the aid of diedary changes and behavioral modification recommended     All questions answered. Contraception: Depo-Provera injections. Discussed healthy lifestyle modifications. Follow up as needed.   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    Answer:   No    Order Specific Question:   Preferred imaging location?    Answer:   [facility] Comprehensive Panel    Standing Status:   Future    Number of Occurrences:   1    Standing Expiration Date:   05/21/2022   Need to obtain previous records Follow up as needed.   Brock Bad, MD 05/21/2021 3:50 PM

## 2021-05-21 NOTE — Progress Notes (Signed)
Pt presents for BTL consult with Dr. Clearance Coots.  Pt supply Depo given L Del w/o difficulty  Next Depo due Oct 27-Nov 10  Normal pap 01/13/21  Administrations This Visit     medroxyPROGESTERone (DEPO-PROVERA) injection 150 mg     Admin Date 05/21/2021 Action Given Dose 150 mg Route Intramuscular Administered By Lewayne Bunting, CMA

## 2021-06-01 LAB — BRCASSURE COMPREHENSIVE PANEL

## 2021-06-04 ENCOUNTER — Other Ambulatory Visit: Payer: Medicaid Other

## 2021-07-23 ENCOUNTER — Ambulatory Visit: Payer: Medicaid Other

## 2021-07-24 ENCOUNTER — Other Ambulatory Visit (HOSPITAL_COMMUNITY)
Admission: RE | Admit: 2021-07-24 | Discharge: 2021-07-24 | Disposition: A | Discharge status: Home / Self Care | Payer: Medicaid Other | Source: Ambulatory Visit | Attending: Obstetrics & Gynecology | Admitting: Obstetrics & Gynecology

## 2021-07-24 ENCOUNTER — Ambulatory Visit (INDEPENDENT_AMBULATORY_CARE_PROVIDER_SITE_OTHER): Payer: Medicaid Other | Admitting: *Deleted

## 2021-07-24 ENCOUNTER — Other Ambulatory Visit: Payer: Self-pay

## 2021-07-24 VITALS — BP 121/77 | HR 76

## 2021-07-24 DIAGNOSIS — Z113 Encounter for screening for infections with a predominantly sexual mode of transmission: Secondary | ICD-10-CM | POA: Diagnosis not present

## 2021-07-24 DIAGNOSIS — A64 Unspecified sexually transmitted disease: Secondary | ICD-10-CM

## 2021-07-24 NOTE — Progress Notes (Signed)
SUBJECTIVE:  30 y.o. female complains of clear and thin vaginal discharge / normal discharge/ increased day(s). Denies abnormal vaginal bleeding or significant pelvic pain or fever. No UTI symptoms. Denies history of known exposure to STD.  No LMP recorded. Patient has had an injection.  OBJECTIVE:  She appears well, afebrile. Urine dipstick: not done.  ASSESSMENT:  Vaginal Discharge  Vaginal Odor   PLAN:  GC, chlamydia, trichomonas, BVAG, CVAG probe sent to lab. Treatment: To be determined once lab results are received ROV prn if symptoms persist or worsen.

## 2021-07-27 LAB — CERVICOVAGINAL ANCILLARY ONLY
Bacterial Vaginitis (gardnerella): NEGATIVE
Candida Glabrata: NEGATIVE
Candida Vaginitis: NEGATIVE
Chlamydia: NEGATIVE
Comment: NEGATIVE
Comment: NEGATIVE
Comment: NEGATIVE
Comment: NEGATIVE
Comment: NEGATIVE
Comment: NORMAL
Neisseria Gonorrhea: NEGATIVE
Trichomonas: NEGATIVE

## 2021-08-11 ENCOUNTER — Ambulatory Visit: Payer: Medicaid Other

## 2021-08-18 ENCOUNTER — Ambulatory Visit (INDEPENDENT_AMBULATORY_CARE_PROVIDER_SITE_OTHER): Payer: Medicaid Other

## 2021-08-18 ENCOUNTER — Other Ambulatory Visit: Payer: Self-pay

## 2021-08-18 VITALS — BP 134/82 | HR 108 | Ht 62.0 in | Wt 202.0 lb

## 2021-08-18 DIAGNOSIS — Z3042 Encounter for surveillance of injectable contraceptive: Secondary | ICD-10-CM | POA: Diagnosis not present

## 2021-08-18 MED ORDER — MEDROXYPROGESTERONE ACETATE 150 MG/ML IM SUSP
150.0000 mg | Freq: Once | INTRAMUSCULAR | Status: AC
  Administered 2021-08-18: 150 mg via INTRAMUSCULAR

## 2021-08-18 NOTE — Progress Notes (Signed)
Depo Provera 150mg  given IM Right Deltoid. Pt tolerated well. No adverse side effects noted. Next injection due 11/03/21-11/17/21. Pt is up to date on yearly exam.

## 2021-09-09 ENCOUNTER — Ambulatory Visit: Payer: Medicaid Other | Admitting: Obstetrics and Gynecology

## 2021-09-28 ENCOUNTER — Ambulatory Visit: Payer: Medicaid Other | Admitting: Obstetrics and Gynecology

## 2021-11-06 DIAGNOSIS — M7989 Other specified soft tissue disorders: Secondary | ICD-10-CM | POA: Diagnosis not present

## 2021-11-06 DIAGNOSIS — M25531 Pain in right wrist: Secondary | ICD-10-CM | POA: Diagnosis not present

## 2021-11-06 DIAGNOSIS — G5601 Carpal tunnel syndrome, right upper limb: Secondary | ICD-10-CM | POA: Diagnosis not present

## 2021-11-06 DIAGNOSIS — M62838 Other muscle spasm: Secondary | ICD-10-CM | POA: Diagnosis not present

## 2021-11-09 ENCOUNTER — Ambulatory Visit: Payer: Medicaid Other

## 2021-11-11 DIAGNOSIS — G5601 Carpal tunnel syndrome, right upper limb: Secondary | ICD-10-CM | POA: Diagnosis not present

## 2021-11-11 DIAGNOSIS — M79641 Pain in right hand: Secondary | ICD-10-CM | POA: Diagnosis not present

## 2021-11-11 DIAGNOSIS — R202 Paresthesia of skin: Secondary | ICD-10-CM | POA: Diagnosis not present

## 2021-11-11 DIAGNOSIS — R200 Anesthesia of skin: Secondary | ICD-10-CM | POA: Diagnosis not present

## 2021-11-11 DIAGNOSIS — G5621 Lesion of ulnar nerve, right upper limb: Secondary | ICD-10-CM | POA: Diagnosis not present

## 2021-11-18 ENCOUNTER — Other Ambulatory Visit: Payer: Self-pay

## 2021-11-18 ENCOUNTER — Ambulatory Visit (INDEPENDENT_AMBULATORY_CARE_PROVIDER_SITE_OTHER): Payer: Medicaid Other | Admitting: *Deleted

## 2021-11-18 DIAGNOSIS — Z3042 Encounter for surveillance of injectable contraceptive: Secondary | ICD-10-CM | POA: Diagnosis not present

## 2021-11-18 MED ORDER — MEDROXYPROGESTERONE ACETATE 150 MG/ML IM SUSP
150.0000 mg | Freq: Once | INTRAMUSCULAR | Status: AC
  Administered 2021-11-18: 150 mg via INTRAMUSCULAR

## 2021-11-18 NOTE — Progress Notes (Signed)
Date last pap: 01/13/21. Last Depo-Provera: 08/18/21. Side Effects if any: NA. Serum HCG indicated? NA. Depo-Provera 150 mg IM given by: Selena Batten. Rolley Sims, RNC Left Deltoid. Next appointment due 02/03/22-02/17/22.

## 2021-12-02 ENCOUNTER — Ambulatory Visit: Payer: Medicaid Other | Admitting: Obstetrics and Gynecology

## 2021-12-02 ENCOUNTER — Encounter: Payer: Self-pay | Admitting: Obstetrics and Gynecology

## 2021-12-02 ENCOUNTER — Other Ambulatory Visit: Payer: Self-pay

## 2021-12-02 DIAGNOSIS — Z3009 Encounter for other general counseling and advice on contraception: Secondary | ICD-10-CM | POA: Insufficient documentation

## 2021-12-02 NOTE — Patient Instructions (Signed)
Salpingectomy      Salpingectomy, also called tubectomy, is the surgical removal of one of the fallopian tubes. The fallopian tubes allow eggs to travel from the ovaries to the uterus. Removing one fallopian tube does not prevent pregnancy. It also does not cause problems with your menstrual periods. You may need this procedure if you:  Have an ectopic pregnancy. This is when a fertilized egg attaches to the fallopian tube instead of the uterus. An ectopic pregnancy can cause the tube to burst or tear (rupture).  Have an infected fallopian tube.  Have cancer of the fallopian tube or nearby organs.  Have had an ovary removed due to a cyst or tumor.  Have had your uterus removed.  Are at high risk for ovarian cancer.  There are three different methods that can be used for a salpingectomy:  An open method in which one large incision is made in your abdomen.  A laparoscopic method in which a thin, lighted tube with a tiny camera (laparoscope) is used to help perform the procedure. The laparoscope allows a surgeon to make several small incisions in the abdomen instead of one large incision.  A robot-assisted method in which a computer is used to control surgical instruments that are attached to robotic arms.  Tell a health care provider about:  Any allergies you have.  All medicines you are taking, including vitamins, herbs, eye drops, creams, and over-the-counter medicines.  Any problems you or family members have had with anesthetic medicines.  Any blood disorders you have.  Any surgeries you have had.  Any medical conditions you have.  Whether you are pregnant or may be pregnant.  What are the risks?  Generally, this is a safe procedure. However, problems may occur, including:  Infection.  Bleeding.  Allergic reactions to medicines.  Blood clots in the legs or lungs.  Damage to nearby structures or organs.  What happens before the procedure?  Staying hydrated  Follow instructions from your health care provider about  hydration, which may include:  Up to 2 hours before the procedure - you may continue to drink clear liquids, such as water, clear fruit juice, black coffee, and plain tea.  Eating and drinking restrictions  Follow instructions from your health care provider about eating and drinking, which may include:  8 hours before the procedure - stop eating heavy meals or foods, such as meat, fried foods, or fatty foods.  6 hours before the procedure - stop eating light meals or foods, such as toast or cereal.  6 hours before the procedure - stop drinking milk or drinks that contain milk.  2 hours before the procedure - stop drinking clear liquids.  Medicines  Ask your health care provider about:  Changing or stopping your regular medicines. This is especially important if you are taking diabetes medicines or blood thinners.  Taking medicines such as aspirin and ibuprofen. These medicines can thin your blood. Do not take these medicines unless your health care provider tells you to take them.  Taking over-the-counter medicines, vitamins, herbs, and supplements.  General instructions  Do not use any products that contain nicotine or tobacco for at least 4 weeks before the procedure. These products include cigarettes, chewing tobacco, and vaping devices, such as e-cigarettes. If you need help quitting, ask your health care provider.  You may have an exam or tests, such as an electrocardiogram (ECG) or a blood or urine test.  Ask your health care provider:  How your surgery   going home right after the procedure, plan to have a responsible adult care for you for the time you are told. This is important. What happens during the  procedure? An IV will be inserted into one of your veins. You will be given one or both of the following: A medicine to help you relax (sedative). A medicine to make you fall asleep (general anesthetic). A small, thin tube (catheter) may be inserted through your urethra and into your bladder. This will drain urine during your procedure. Depending on the type of procedure you are having, one incision or several small incisions will be made in your abdomen. Your fallopian tube and ovary will be cut away from the uterus and removed. Your blood vessels will be clamped and tied to prevent excess bleeding. The incision or incisions in your abdomen will be closed with stitches (sutures), staples, or skin glue. A bandage (dressing) may be placed over your incision or incisions. The procedure may vary among health care providers and hospitals. What happens after the procedure?  Your blood pressure, heart rate, breathing rate, and blood oxygen level will be monitored until you leave the hospital or clinic. You may continue to receive fluids and medicines through an IV. You may continue to have a catheter draining your urine. You may have to wear compression stockings. These stockings help to prevent blood clots and reduce swelling in your legs. You will be given pain medicine as needed. If you were given a sedative during the procedure, it can affect you for several hours. Do not drive or operate machinery until your health care provider says that it is safe. Summary Salpingectomy is a surgical procedure to remove one of the fallopian tubes. The procedure may be done with an open incision, a thin, lighted tube with a tiny camera (laparoscope), or computer-controlled instruments. Depending on the type of procedure you have, one incision or several small incisions will be made in your abdomen. Your blood pressure, heart rate, breathing rate, and blood oxygen level will be monitored until you leave the  hospital or clinic. Plan to have a responsible adult take you home from the hospital or clinic. This information is not intended to replace advice given to you by your health care provider. Make sure you discuss any questions you have with your healthcare provider. Document Revised: 08/19/2020 Document Reviewed: 08/19/2020 Elsevier Patient Education  2022 [employer]  Salpingectomy, Care After The following information offers guidance on how to care for yourself after your procedure. Your health care provider may also give you more specific instructions. If you have problems or questions, contact your health careprovider. What can I expect after the procedure? After the procedure, it is common to have: Pain in your abdomen. Light vaginal bleeding (spotting) for a few days. Tiredness. Your recovery time will depend on which method was used for your surgery. Follow these instructions at home: Medicines Take over-the-counter and prescription medicines only as told by your health care provider. Ask your health care provider if the medicine prescribed to you: Requires you to avoid driving or using machinery. Can cause constipation. You may need to take actions to prevent or treat constipation, such as: Drink enough fluid to keep your urine pale yellow. Take over-the-counter or prescription medicines. Eat foods that are high in fiber, such as beans, whole grains, and fresh fruits and vegetables. Limit foods that are high in fat and processed sugars, such as fried or sweet foods. Incision care  Follow instructions from your   your health care provider about how to take care of your incision or incisions. Make sure you: Wash your hands with soap and water for at least 20 seconds before and after you change your bandage (dressing). If soap and water are not available, use hand sanitizer. Change or remove your dressing as told by your health care provider. Leave stitches (sutures), skin glue, staples,  or adhesive strips in place. These skin closures may need to stay in place for 2 weeks or longer. If adhesive strip edges start to loosen and curl up, you may trim the loose edges. Do not remove adhesive strips completely unless your health care provider tells you to do that. Keep your dressing clean and dry. Check your incision area every day for signs of infection. Check for: Redness, swelling, or pain that gets worse. Fluid or blood. Warmth. Pus or a bad smell. Activity Rest as told by your health care provider. Avoid sitting for a long time without moving. Get up to take short walks every 1-2 hours. This is important to improve blood flow and breathing. Ask for help if you feel weak or unsteady. Return to your normal activities as told by your health care provider. Ask your health care provider what activities are safe for you. Do not drive until your health care provider says that it is safe. Do not lift anything that is heavier than 10 lb (4.5 kg), or the limit that you are told, until your health care provider says that it is safe. This may last for 2-6 weeks depending on your surgery. Do not douche, use tampons, or have sex until your health care provider approves. General instructions Do not use any products that contain nicotine or tobacco. These products include cigarettes, chewing tobacco, and vaping devices, such as e-cigarettes. These can delay healing after surgery. If you need help quitting, ask your health care provider. Wear compression stockings as told by your health care provider. These stockings help to prevent blood clots and reduce swelling in your legs. Do not take baths, swim, or use a hot tub until your health care provider approves. You may take showers. Keep all follow-up visits. This is important. Contact a health care provider if: You have pain when you urinate. You have redness, swelling, or more pain around an incision or an incision feels warm to the touch. You  have pus, fluid, blood, or a bad smell coming from an incision or an incision starts to open. You have a fever. You have abdominal pain that gets worse or does not get better with medicine. You have a rash. You feel light-headed, have nausea and vomiting, or both. Get help right away if: You have pain in your chest or leg. You develop shortness of breath. You faint. You have increased or heavy vaginal bleeding, such as soaking a sanitary napkin in an hour. These symptoms may represent a serious problem that is an emergency. Do not wait to see if the symptoms will go away. Get medical help right away. Call your local emergency services (911 in the [place] Do not drive yourself to the hospital. Summary After the procedure, it is common to feel tired, have pain in your abdomen, and have light vaginal bleeding for a few days. Follow instructions from your health care provider about how to take care of your incision or incisions. Return to your normal activities as told by your health care provider. Ask your health care provider what activities are safe for you. Do not  douche, use tampons, or have sex until your health care provider approves. Keep all follow-up visits. This is important. This information is not intended to replace advice given to you by your health care provider. Make sure you discuss any questions you have with your health care provider. Document Revised: 08/19/2020 Document Reviewed: 08/19/2020 Elsevier Patient Education  2022 [employer].

## 2021-12-02 NOTE — Progress Notes (Signed)
Barbara Cox presents for consultation for BTL. Currently on Depo Provera Pap smear UTP G2P2  PE AF VSS Lungs clear Heart RRR Abd soft + BS  A/P Unwanted fertility  Patient desires bilateral tubal sterilization.  Other reversible forms of contraception were discussed with patient; she declines all other modalities. Discussed bilateral tubal sterilization in detail; discussed options of laparoscopic bilateral tubal sterilization using Filshie clips vs laparoscopic bilateral salpingectomy. Risks and benefits discussed in detail including but not limited to: risk of regret, permanence of method, bleeding, infection, injury to surrounding organs and need for additional procedures.  Failure risk of 1-2 % for Filshie clips and <1% for bilateral salpingectomy with increased risk of ectopic gestation if pregnancy occurs was also discussed with patient.  Also discussed possible reduction of risk of ovarian cancer via bilateral salpingectomy given that a growing body of knowledge reveals that the majority of cases of high grade serous ovarian cancer actually are actually  cancers arising from the fimbriated end of the fallopian tubes. Emphasized that removal of fallopian tubes do not result in any known hormonal imbalance.  Patient verbalized understanding of these risks and benefits and wants to proceed with sterilization with laparoscopic bilateral sterilization via salpingectomy.    She was told that she will be contacted by our surgical scheduler regarding the time and date of her surgery; routine preoperative instructions of having nothing to eat or drink after midnight on the day prior to surgery and also coming to the hospital 1 1/2 hours prior to her time of surgery were also emphasized.  She was told she may be called for a preoperative appointment about a week prior to surgery and will be given further preoperative instructions at that visit.  Routine postoperative instructions will be reviewed with the  patient and her family in detail after surgery. Printed patient education handouts about the procedure was given to the patient to review at home.  Medicaid papers have been signed , patient understands that surgery will be scheduled at least 30 days after the day papers are signed as per Medicaid guidelines.  In the meantime, patient will use Depo Provera for contraception prior to surgery.

## 2021-12-02 NOTE — Progress Notes (Signed)
Patient presents for a BTL consult. Patient as no other concerns today.  Last Pap 01/13/2021

## 2022-01-25 NOTE — H&P (Signed)
Barbara Cox is an 31 y.o. female  with unwanted fertility. ? ? ?Menstrual History: ?Menarche age: 43 ?No LMP recorded. Patient has had an injection. ?  ? ?Past Medical History:  ?Diagnosis Date  ? Anemia   ? Thrombocytopenia affecting pregnancy (HCC)   ? ? ?Past Surgical History:  ?Procedure Laterality Date  ? NO PAST SURGERIES    ? ? ?Family History  ?Problem Relation Age of Onset  ? Leukemia Father   ? Breast cancer Sister   ? ? ?Social History:  reports that she quit smoking about 3 years ago. Her smoking use included cigarettes. She has never used smokeless tobacco. She reports that she does not drink alcohol and does not use drugs. ? ?Allergies:  ?Allergies  ?Allergen Reactions  ? Peanuts [Peanut Oil] Anaphylaxis  ? ? ?No medications prior to admission.  ? ? ?Review of Systems  ?Constitutional: Negative.   ?Respiratory: Negative.    ?Cardiovascular: Negative.   ?Gastrointestinal: Negative.   ?Genitourinary: Negative.   ? ?There were no vitals taken for this visit. ?Physical Exam ?Constitutional:   ?   Appearance: Normal appearance.  ?Cardiovascular:  ?   Rate and Rhythm: Normal rate and regular rhythm.  ?Pulmonary:  ?   Effort: Pulmonary effort is normal.  ?   Breath sounds: Normal breath sounds.  ?Abdominal:  ?   General: Bowel sounds are normal.  ?   Palpations: Abdomen is soft.  ?Genitourinary: ?   Comments: Nl EGBUS, uterus, small, mobile, no masses ?Neurological:  ?   Mental Status: She is alert.  ? ? ?No results found for this or any previous visit (from the past 24 hour(s)). ? ?No results found. ? ?Assessment/Plan: ?Unwanted fertility ? ?Laparoscopic bilateral salpingectomy reviewed with pt R/B/Post op care reviewed. Pt desires to proceed.  ? ?Hermina Staggers ?01/25/2022, 7:37 PM ? ?

## 2022-01-26 ENCOUNTER — Ambulatory Visit: Payer: Medicaid Other | Admitting: Obstetrics & Gynecology

## 2022-01-26 NOTE — Progress Notes (Signed)
Surgical Instructions ? ? ? Your procedure is scheduled on Tuesday, April 25th. ? Report to [facility] Main Entrance "A" at 11:30 A.M., then check in with the Admitting office. ? Call this number if you have problems the morning of surgery: ? [phone] ? ? If you have any questions prior to your surgery date call [phone]: Open Monday-Friday 8am-4pm ? ? ? Remember: ? Do not eat after midnight the night before your surgery ? ?You may drink clear liquids until 10:30 AM the morning of your surgery.   ?Clear liquids allowed are: Water, Non-Citrus Juices (without pulp), Carbonated Beverages, Clear Tea, Black Coffee ONLY (NO MILK, CREAM OR POWDERED CREAMER of any kind), and Gatorade ?  ? Take these medicines the morning of surgery with A SIP OF WATER:  ? NONE ? ? ?As of today, STOP taking any Aspirin (unless otherwise instructed by your surgeon) Aleve, Naproxen, Ibuprofen, Motrin, Advil, Goody's, BC's, all herbal medications, fish oil, and all vitamins. ? ?         DAY OF SURGERY: ?Do not wear jewelry or makeup ?Do not wear lotions, powders, perfumes, or deodorant. ?Do not shave 48 hours prior to surgery.   ?Do not bring valuables to the hospital. ?Do not wear nail polish, gel polish, artificial nails, or any other type of covering on natural nails (fingers and toes) ?If you have artificial nails or gel coating that need to be removed by a nail salon, please have this removed prior to surgery. Artificial nails or gel coating may interfere with anesthesia's ability to adequately monitor your vital signs. ? ?[facility] is not responsible for any belongings or valuables. .  ? ?Do NOT Smoke (Tobacco/Vaping)  24 hours prior to your procedure ? ?If you use a CPAP at night, you may bring your mask for your overnight stay. ?  ?Contacts, glasses, hearing aids, dentures or partials may not be worn into surgery, please bring cases for these belongings ?  ?For patients admitted to the hospital, discharge time will be  determined by your treatment team. ?  ?Patients discharged the day of surgery will not be allowed to drive home, and someone needs to stay with them for 24 hours. ? ? ?SURGICAL WAITING ROOM VISITATION ?Patients having surgery or a procedure in a hospital may have two support people. ?Children under the age of 33 must have an adult with them who is not the patient. ?They may stay in the waiting area during the procedure and may switch out with other visitors. If the patient needs to stay at the hospital during part of their recovery, the visitor guidelines for inpatient rooms apply. ? ?Please refer to the [facility] website for the visitor guidelines for Inpatients (after your surgery is over and you are in a regular room).  ? ? ?Special instructions:   ? ?Oral Hygiene is also important to reduce your risk of infection.  Remember - BRUSH YOUR TEETH THE MORNING OF SURGERY WITH YOUR REGULAR TOOTHPASTE ? ? ?[facility]- Preparing For Surgery ? ?Before surgery, you can play an important role. Because skin is not sterile, your skin needs to be as free of germs as possible. You can reduce the number of germs on your skin by washing with CHG (chlorahexidine gluconate) Soap before surgery.  CHG is an antiseptic cleaner which kills germs and bonds with the skin to continue killing germs even after washing.   ? ? ?Please do not use if you have an allergy to CHG or  antibacterial soaps. If your skin becomes reddened/irritated stop using the CHG.  ?Do not shave (including legs and underarms) for at least 48 hours prior to first CHG shower. It is OK to shave your face. ? ?Please follow these instructions carefully. ?  ? ? Shower the NIGHT BEFORE SURGERY and the MORNING OF SURGERY with CHG Soap.  ? If you chose to wash your hair, wash your hair first as usual with your normal shampoo. After you shampoo, rinse your hair and body thoroughly to remove the shampoo.  Then [employer] and genitals (private parts) with your normal soap and  rinse thoroughly to remove soap. ? ?After that Use CHG Soap as you would any other liquid soap. You can apply CHG directly to the skin and wash gently with a scrungie or a clean washcloth.  ? ?Apply the CHG Soap to your body ONLY FROM THE NECK DOWN.  Do not use on open wounds or open sores. Avoid contact with your eyes, ears, mouth and genitals (private parts). Wash Face and genitals (private parts)  with your normal soap.  ? ?Wash thoroughly, paying special attention to the area where your surgery will be performed. ? ?Thoroughly rinse your body with warm water from the neck down. ? ?DO NOT shower/wash with your normal soap after using and rinsing off the CHG Soap. ? ?Pat yourself dry with a CLEAN TOWEL. ? ?Wear CLEAN PAJAMAS to bed the night before surgery ? ?Place CLEAN SHEETS on your bed the night before your surgery ? ?DO NOT SLEEP WITH PETS. ? ? ?Day of Surgery: ? ?Take a shower with CHG soap. ?Wear Clean/Comfortable clothing the morning of surgery ?Do not apply any deodorants/lotions.   ?Remember to brush your teeth WITH YOUR REGULAR TOOTHPASTE. ? ? ?Please read over the following fact sheets that you were given.  ? ?

## 2022-01-27 ENCOUNTER — Inpatient Hospital Stay (HOSPITAL_COMMUNITY)
Admission: RE | Admit: 2022-01-27 | Discharge: 2022-01-27 | Disposition: A | Discharge status: Home / Self Care | Payer: Medicaid Other | Source: Ambulatory Visit

## 2022-02-01 ENCOUNTER — Other Ambulatory Visit: Payer: Self-pay

## 2022-02-01 ENCOUNTER — Encounter (HOSPITAL_COMMUNITY): Payer: Self-pay | Admitting: Obstetrics and Gynecology

## 2022-02-01 NOTE — Progress Notes (Signed)
Spoke with pt for pre-op call. Pt denies cardiac history, HTN or diabetes. ? ?Shower instructions given to pt.  ?

## 2022-02-02 ENCOUNTER — Encounter (HOSPITAL_COMMUNITY)
Admission: RE | Disposition: A | Discharge status: Home / Self Care | Payer: Self-pay | Source: Ambulatory Visit | Attending: Obstetrics and Gynecology

## 2022-02-02 ENCOUNTER — Ambulatory Visit (HOSPITAL_COMMUNITY)
Admission: RE | Admit: 2022-02-02 | Discharge: 2022-02-02 | Disposition: A | Discharge status: Home / Self Care | Payer: Medicaid Other | Source: Ambulatory Visit | Attending: Obstetrics and Gynecology | Admitting: Obstetrics and Gynecology

## 2022-02-02 ENCOUNTER — Ambulatory Visit (HOSPITAL_BASED_OUTPATIENT_CLINIC_OR_DEPARTMENT_OTHER): Payer: Medicaid Other | Admitting: Anesthesiology

## 2022-02-02 ENCOUNTER — Encounter (HOSPITAL_COMMUNITY): Payer: Self-pay | Admitting: Obstetrics and Gynecology

## 2022-02-02 ENCOUNTER — Other Ambulatory Visit: Payer: Self-pay

## 2022-02-02 ENCOUNTER — Ambulatory Visit (HOSPITAL_COMMUNITY): Payer: Medicaid Other | Admitting: Anesthesiology

## 2022-02-02 DIAGNOSIS — Z302 Encounter for sterilization: Secondary | ICD-10-CM

## 2022-02-02 DIAGNOSIS — E669 Obesity, unspecified: Secondary | ICD-10-CM | POA: Diagnosis not present

## 2022-02-02 DIAGNOSIS — Z6831 Body mass index (BMI) 31.0-31.9, adult: Secondary | ICD-10-CM | POA: Insufficient documentation

## 2022-02-02 DIAGNOSIS — Z3009 Encounter for other general counseling and advice on contraception: Secondary | ICD-10-CM

## 2022-02-02 DIAGNOSIS — Z87891 Personal history of nicotine dependence: Secondary | ICD-10-CM | POA: Diagnosis not present

## 2022-02-02 HISTORY — DX: Myoneural disorder, unspecified: G70.9

## 2022-02-02 HISTORY — PX: LAPAROSCOPIC BILATERAL SALPINGECTOMY: SHX5889

## 2022-02-02 HISTORY — DX: Bronchitis, not specified as acute or chronic: J40

## 2022-02-02 LAB — BASIC METABOLIC PANEL
Anion gap: 6 (ref 5–15)
BUN: 10 mg/dL (ref 6–20)
CO2: 21 mmol/L — ABNORMAL LOW (ref 22–32)
Calcium: 8.1 mg/dL — ABNORMAL LOW (ref 8.9–10.3)
Chloride: 110 mmol/L (ref 98–111)
Creatinine, Ser: 0.86 mg/dL (ref 0.44–1.00)
GFR, Estimated: >60 mL/min (ref >60)
Glucose, Bld: 84 mg/dL (ref 70–99)
Potassium: 4.1 mmol/L (ref 3.5–5.1)
Sodium: 137 mmol/L (ref 135–145)

## 2022-02-02 LAB — CBC
HCT: 42.9 % (ref 36.0–46.0)
Hemoglobin: 13.7 g/dL (ref 12.0–15.0)
MCH: 29.3 pg (ref 26.0–34.0)
MCHC: 31.9 g/dL (ref 30.0–36.0)
MCV: 91.9 fL (ref 80.0–100.0)
Platelets: 148 10*3/uL — ABNORMAL LOW (ref 150–400)
RBC: 4.67 MIL/uL (ref 3.87–5.11)
RDW: 12.9 % (ref 11.5–15.5)
WBC: 3.4 10*3/uL — ABNORMAL LOW (ref 4.0–10.5)
nRBC: 0 % (ref 0.0–0.2)

## 2022-02-02 LAB — POCT PREGNANCY, URINE: Preg Test, Ur: NEGATIVE

## 2022-02-02 SURGERY — SALPINGECTOMY, BILATERAL, LAPAROSCOPIC
Anesthesia: General | Site: Abdomen | Laterality: Bilateral

## 2022-02-02 MED ORDER — DEXAMETHASONE SODIUM PHOSPHATE 10 MG/ML IJ SOLN
INTRAMUSCULAR | Status: DC | PRN
  Administered 2022-02-02: 10 mg via INTRAVENOUS

## 2022-02-02 MED ORDER — MIDAZOLAM HCL 2 MG/2ML IJ SOLN
INTRAMUSCULAR | Status: DC | PRN
  Administered 2022-02-02: 2 mg via INTRAVENOUS

## 2022-02-02 MED ORDER — ROCURONIUM BROMIDE 10 MG/ML (PF) SYRINGE
PREFILLED_SYRINGE | INTRAVENOUS | Status: AC
  Filled 2022-02-02: qty 10

## 2022-02-02 MED ORDER — LACTATED RINGERS IV SOLN: INTRAVENOUS | Status: DC

## 2022-02-02 MED ORDER — SCOPOLAMINE 1 MG/3DAYS TD PT72
MEDICATED_PATCH | TRANSDERMAL | Status: DC
Start: 2022-02-02 — End: 2022-02-02
  Administered 2022-02-02: 1.5 mg via TRANSDERMAL
  Filled 2022-02-02: qty 1

## 2022-02-02 MED ORDER — 0.9 % SODIUM CHLORIDE (POUR BTL) OPTIME
TOPICAL | Status: DC | PRN
  Administered 2022-02-02: 1000 mL

## 2022-02-02 MED ORDER — LIDOCAINE 2% (20 MG/ML) 5 ML SYRINGE
INTRAMUSCULAR | Status: AC
  Filled 2022-02-02: qty 5

## 2022-02-02 MED ORDER — FENTANYL CITRATE (PF) 250 MCG/5ML IJ SOLN
INTRAMUSCULAR | Status: DC | PRN
  Administered 2022-02-02 (×3): 50 ug via INTRAVENOUS
  Administered 2022-02-02: 100 ug via INTRAVENOUS

## 2022-02-02 MED ORDER — ONDANSETRON HCL 4 MG/2ML IJ SOLN
INTRAMUSCULAR | Status: DC | PRN
  Administered 2022-02-02: 4 mg via INTRAVENOUS

## 2022-02-02 MED ORDER — PROPOFOL 10 MG/ML IV BOLUS
INTRAVENOUS | Status: DC | PRN
  Administered 2022-02-02: 200 mg via INTRAVENOUS

## 2022-02-02 MED ORDER — LIDOCAINE 2% (20 MG/ML) 5 ML SYRINGE
INTRAMUSCULAR | Status: DC | PRN
  Administered 2022-02-02: 60 mg via INTRAVENOUS

## 2022-02-02 MED ORDER — IBUPROFEN 800 MG PO TABS: 800.0000 mg | ORAL_TABLET | Freq: Three times a day (TID) | ORAL | 0 refills | Status: DC | PRN

## 2022-02-02 MED ORDER — POVIDONE-IODINE 10 % EX SWAB
2.0000 "application " | Freq: Once | CUTANEOUS | Status: AC
  Administered 2022-02-02: 2 via TOPICAL

## 2022-02-02 MED ORDER — BUPIVACAINE HCL (PF) 0.5 % IJ SOLN
INTRAMUSCULAR | Status: AC
  Filled 2022-02-02: qty 30

## 2022-02-02 MED ORDER — BUPIVACAINE HCL 0.5 % IJ SOLN
INTRAMUSCULAR | Status: DC | PRN
  Administered 2022-02-02: 30 mL

## 2022-02-02 MED ORDER — ONDANSETRON HCL 4 MG/2ML IJ SOLN
INTRAMUSCULAR | Status: AC
  Filled 2022-02-02: qty 2

## 2022-02-02 MED ORDER — PROPOFOL 10 MG/ML IV BOLUS
INTRAVENOUS | Status: AC
  Filled 2022-02-02: qty 20

## 2022-02-02 MED ORDER — KETOROLAC TROMETHAMINE 15 MG/ML IJ SOLN
15.0000 mg | INTRAMUSCULAR | Status: AC
  Administered 2022-02-02: 15 mg via INTRAVENOUS
  Filled 2022-02-02: qty 1

## 2022-02-02 MED ORDER — OXYCODONE HCL 5 MG PO TABS: 5.0000 mg | ORAL_TABLET | Freq: Four times a day (QID) | ORAL | 0 refills | Status: DC | PRN

## 2022-02-02 MED ORDER — SUGAMMADEX SODIUM 200 MG/2ML IV SOLN
INTRAVENOUS | Status: DC | PRN
  Administered 2022-02-02: 200 mg via INTRAVENOUS

## 2022-02-02 MED ORDER — DEXAMETHASONE SODIUM PHOSPHATE 10 MG/ML IJ SOLN
INTRAMUSCULAR | Status: AC
  Filled 2022-02-02: qty 1

## 2022-02-02 MED ORDER — ROCURONIUM BROMIDE 10 MG/ML (PF) SYRINGE
PREFILLED_SYRINGE | INTRAVENOUS | Status: DC | PRN
Start: 2022-02-02 — End: 2022-02-02
  Administered 2022-02-02: 50 mg via INTRAVENOUS

## 2022-02-02 MED ORDER — MIDAZOLAM HCL 2 MG/2ML IJ SOLN
INTRAMUSCULAR | Status: AC
  Filled 2022-02-02: qty 2

## 2022-02-02 MED ORDER — KETOROLAC TROMETHAMINE 15 MG/ML IJ SOLN
INTRAMUSCULAR | Status: DC | PRN
  Administered 2022-02-02: 15 mg via INTRAVENOUS

## 2022-02-02 MED ORDER — CEFAZOLIN SODIUM-DEXTROSE 2-4 GM/100ML-% IV SOLN
2.0000 g | INTRAVENOUS | Status: AC
  Administered 2022-02-02: 2 g via INTRAVENOUS
  Filled 2022-02-02: qty 100

## 2022-02-02 MED ORDER — ORAL CARE MOUTH RINSE: 15.0000 mL | Freq: Once | OROMUCOSAL | Status: AC

## 2022-02-02 MED ORDER — SCOPOLAMINE 1 MG/3DAYS TD PT72: 1.0000 | MEDICATED_PATCH | TRANSDERMAL | Status: DC

## 2022-02-02 MED ORDER — ACETAMINOPHEN 500 MG PO TABS
1000.0000 mg | ORAL_TABLET | ORAL | Status: AC
  Administered 2022-02-02: 1000 mg via ORAL
  Filled 2022-02-02: qty 2

## 2022-02-02 MED ORDER — FENTANYL CITRATE (PF) 250 MCG/5ML IJ SOLN
INTRAMUSCULAR | Status: AC
  Filled 2022-02-02: qty 5

## 2022-02-02 MED ORDER — SOD CITRATE-CITRIC ACID 500-334 MG/5ML PO SOLN
30.0000 mL | ORAL | Status: AC
  Administered 2022-02-02: 30 mL via ORAL
  Filled 2022-02-02: qty 30

## 2022-02-02 MED ORDER — CHLORHEXIDINE GLUCONATE 0.12 % MT SOLN
15.0000 mL | Freq: Once | OROMUCOSAL | Status: AC
  Administered 2022-02-02: 15 mL via OROMUCOSAL
  Filled 2022-02-02: qty 15

## 2022-02-02 MED ORDER — KETOROLAC TROMETHAMINE 30 MG/ML IJ SOLN
INTRAMUSCULAR | Status: AC
  Filled 2022-02-02: qty 1

## 2022-02-02 SURGICAL SUPPLY — 37 items
APPLICATOR ARISTA FLEXITIP XL (MISCELLANEOUS) IMPLANT
DERMABOND ADVANCED (GAUZE/BANDAGES/DRESSINGS) ×1
DERMABOND ADVANCED .7 DNX12 (GAUZE/BANDAGES/DRESSINGS) ×1 IMPLANT
DRSG OPSITE POSTOP 3X4 (GAUZE/BANDAGES/DRESSINGS) ×3 IMPLANT
DURAPREP 26ML APPLICATOR (WOUND CARE) ×2 IMPLANT
GLOVE BIO SURGEON STRL SZ7.5 (GLOVE) ×2 IMPLANT
GLOVE BIOGEL PI IND STRL 7.0 (GLOVE) ×2 IMPLANT
GLOVE BIOGEL PI IND STRL 8 (GLOVE) ×1 IMPLANT
GLOVE BIOGEL PI INDICATOR 7.0 (GLOVE) ×2
GLOVE BIOGEL PI INDICATOR 8 (GLOVE) ×1
GOWN STRL REUS W/ TWL LRG LVL3 (GOWN DISPOSABLE) ×1 IMPLANT
GOWN STRL REUS W/ TWL XL LVL3 (GOWN DISPOSABLE) ×1 IMPLANT
GOWN STRL REUS W/TWL LRG LVL3 (GOWN DISPOSABLE) ×1
GOWN STRL REUS W/TWL XL LVL3 (GOWN DISPOSABLE) ×1
HEMOSTAT ARISTA ABSORB 3G PWDR (HEMOSTASIS) IMPLANT
IRRIG SUCT STRYKERFLOW 2 WTIP (MISCELLANEOUS)
IRRIGATION SUCT STRKRFLW 2 WTP (MISCELLANEOUS) IMPLANT
KIT TURNOVER KIT B (KITS) ×2 IMPLANT
NDL INSUFF ACCESS 14 VERSASTEP (NEEDLE) IMPLANT
NS IRRIG 1000ML POUR BTL (IV SOLUTION) ×2 IMPLANT
PACK LAPAROSCOPY BASIN (CUSTOM PROCEDURE TRAY) ×2 IMPLANT
PACK TRENDGUARD 450 HYBRID PRO (MISCELLANEOUS) ×1 IMPLANT
POUCH SPECIMEN RETRIEVAL 10MM (ENDOMECHANICALS) IMPLANT
PROTECTOR NERVE ULNAR (MISCELLANEOUS) ×4 IMPLANT
SET TUBE SMOKE EVAC HIGH FLOW (TUBING) ×2 IMPLANT
SHEARS HARMONIC ACE PLUS 36CM (ENDOMECHANICALS) IMPLANT
SLEEVE XCEL OPT CAN 5 100 (ENDOMECHANICALS) ×2 IMPLANT
SUT MNCRL AB 4-0 PS2 18 (SUTURE) ×2 IMPLANT
SUT VICRYL 0 UR6 27IN ABS (SUTURE) ×2 IMPLANT
TOWEL GREEN STERILE FF (TOWEL DISPOSABLE) ×4 IMPLANT
TRAY FOLEY W/BAG SLVR 14FR (SET/KITS/TRAYS/PACK) ×2 IMPLANT
TRENDGUARD 450 HYBRID PRO PACK (MISCELLANEOUS) ×2
TROCAR BALLN 12MMX100 BLUNT (TROCAR) ×2 IMPLANT
TROCAR VERSASTEP PLUS 12MM (TROCAR) IMPLANT
TROCAR VERSASTEP PLUS 5MM (TROCAR) IMPLANT
TROCAR XCEL NON-BLD 5MMX100MML (ENDOMECHANICALS) ×2 IMPLANT
WARMER LAPAROSCOPE (MISCELLANEOUS) ×2 IMPLANT

## 2022-02-02 NOTE — Discharge Instructions (Signed)
Laparoscopic BilateralTubal Removal/Laparoscopic Bilateral Salpingectomy Laparoscopic tubal removal is a procedure that removes the fallopian tubes at a time other than right after childbirth. By removing the fallopian tubes, the eggs that are released from the ovaries cannot enter the uterus and sperm cannot reach the egg. This is more effective than tubal ligation which is also known as getting your "tubes tied." Tubal removal is done so you will not be able to get pregnant or have a baby.  This procedure is permanent and irreversible. If you want to have future pregnancies, you should not have this procedure.  LET YOUR CAREGIVER KNOW ABOUT: Allergies to food or medicine. Medicines taken, including vitamins, herbs, eyedrops, over-the-counter medicines, and creams. Use of steroids (by mouth or creams). Previous problems with numbing medicines. History of bleeding problems or blood clots. Any recent colds or infections. Previous surgery. Other health problems, including diabetes and kidney problems. Possibility of pregnancy, if this applies. Any past pregnancies. RISKS AND COMPLICATIONS  Infection. Bleeding. Injury to surrounding organs. Anesthetic side effects. Failure of the procedure. Ectopic pregnancy. Future regret about having the procedure done. BEFORE THE PROCEDURE Do not take aspirin or blood thinners a week before the procedure or as directed. This can cause bleeding. Do not eat or drink anything 6 to 8 hours before the procedure. PROCEDURE  You may be given a medicine to help you relax (sedative) before the procedure. You will be given a medicine to make you sleep (general anesthetic) during the procedure. A tube will be put down your throat to help your breath while under general anesthesia. Two small cuts (incisions) are made in the lower abdominal area and one incision is made near the belly button. Your abdominal area will be inflated with a safe gas (carbon dioxide). This  helps give the surgeon room to operate, visualize, and helps the surgeon avoid other organs. A thin, lighted tube (laparoscope) with a camera attached is inserted into your abdomen through the incision near the belly button. Other small instruments are also inserted through the other abdominal incisions. The fallopian tubes are located and are removed. After the fallopian tubes are removed, the gas is released from the abdomen. The incisions will be closed with stitches (sutures), and Dermabond. A bandage may be placed over the incisions. AFTER THE PROCEDURE  You will also have some mild abdominal discomfort for 3-7 days. You will be given pain medicine to ease any discomfort. As long as there are no problems, you may be allowed to go home. Someone will need to drive you home and be with you for at least 24 hours once home. You may have some mild discomfort in the throat. This is from the tube placed in your throat while you were sleeping. You may experience discomfort in the shoulder area from some trapped air between the liver and diaphragm. This sensation is normal and will slowly go away on its own. HOME CARE INSTRUCTIONS  Take all medicines as directed. Only take over-the-counter or prescription medicines for pain, discomfort, or fever as directed by your caregiver. Resume daily activities as directed. Showers are preferred over baths. You may resume sexual activities in 1 week or as directed. Do not drive while taking narcotics. SEEK MEDICAL CARE IF: . There is increasing abdominal pain. You feel lightheaded or faint. You have the chills. You have an oral temperature above 102 F (38.9 C). There is pus-like (purulent) drainage from any of the wounds. You are unable to pass gas or have a   bowel movement. You feel sick to your stomach (nauseous) or throw up (vomit). MAKE SURE YOU:  Understand these instructions. Will watch your condition. Will get help right away if you are not doing  well or get worse.  [employer] Patient Information 2013 [employer].    

## 2022-02-02 NOTE — Anesthesia Procedure Notes (Signed)
Procedure Name: Intubation ?Date/Time: 02/02/2022 1:19 PM ?Performed by: Shary Decamp, CRNA ?Pre-anesthesia Checklist: Patient identified, Patient being monitored, Timeout performed, Emergency Drugs available and Suction available ?Patient Re-evaluated:Patient Re-evaluated prior to induction ?Oxygen Delivery Method: Circle System Utilized ?Preoxygenation: Pre-oxygenation with 100% oxygen ?Induction Type: IV induction ?Ventilation: Mask ventilation without difficulty ?Laryngoscope Size: Hyacinth Meeker and 2 ?Grade View: Grade I ?Tube type: Oral ?Tube size: 7.0 mm ?Number of attempts: 1 ?Airway Equipment and Method: Stylet ?Placement Confirmation: ETT inserted through vocal cords under direct vision, positive ETCO2 and breath sounds checked- equal and bilateral ?Secured at: 21 cm ?Tube secured with: Tape ?Dental Injury: Teeth and Oropharynx as per pre-operative assessment  ? ? ? ? ?

## 2022-02-02 NOTE — Op Note (Signed)
Barbara Cox ?PROCEDURE DATE: 02/02/2022 ? ? ?PREOPERATIVE DIAGNOSIS:  Undesired fertility ? ?POSTOPERATIVE DIAGNOSIS:  Undesired fertility ? ?PROCEDURE:  Laparoscopic Bilateral Salpingectomy  ? ?SURGEON: Nettie Elm MD ? ? ? ?ANESTHESIA:  General endotracheal ? ?COMPLICATIONS:  None immediate. ? ?ESTIMATED BLOOD LOSS:  25 ml. ? ?FLUIDS: As recorded ? ?URINE OUTPUT:  As recorded ? ?IINDICATIONS: 31 y.o. [name] with undesired fertility, desires permanent sterilization. Other reversible forms of contraception were discussed with patient; she declines all other modalities.  Risks of procedure discussed with patient including permanence of method, risk of regret, bleeding, infection, injury to surrounding organs and need for additional procedures including laparotomy.  Failure risk less than 0.5% with increased risk of ectopic gestation if pregnancy occurs was also discussed with patient.  Written informed consent was obtained. ?   ?FINDINGS:  Normal uterus, fallopian tubes, and ovaries. ? ?TECHNIQUE:  The patient was taken to the operating room where general anesthesia was obtained without difficulty.  She was then placed in the dorsal lithotomy position and prepared and draped in sterile fashion.  After an adequate timeout was performed, a bivalved speculum was then placed in the patient's vagina, and the anterior lip of cervix grasped with the single-tooth tenaculum.  The uterine manipulator was then advanced into the uterus.  The speculum was removed from the vagina. ? ?Attention was then turned to the patient's abdomen where a 11-mm skin incision was made in the umbilical fold.  The Optiview 11-mm trocar and sleeve were then advanced without difficulty with the laparoscope under direct visualization into the abdomen.  The abdomen was then insufflated with carbon dioxide gas.  Adequate pneumoperitoneum was obtained.  A survey of the patient's pelvis and abdomen revealed the findings above. Bilateral 5-mm lower  quadrant ports  were then placed under direct visualization.  The fallopian tubes were transected from the uterine attachments and the underlying mesosalpinx with the Harmonic device allowing for bilateral salpingectomy.  The fallopian tubes were then removed from the abdomen under direct visualization.  The operative site was surveyed, and it was found to be hemostatic.   No intraoperative injury to other surrounding organs was noted.  The abdomen was desufflated and all instruments were then removed from the patient's abdomen. The fascial incision of the umbilicus was closed with a 0 Vicryl figure of eight stitch. All skin incisions were closed with Dermabond.  The uterine manipulator was removed from the cervix without complications. The patient tolerated the procedure well.  Sponge, lap, and needle counts were correct times two.  The patient was then taken to the recovery room awake, extubated and in stable condition. ? ?The patient will be discharged to home as per PACU criteria.  Routine postoperative instructions given.  She was prescribed Percocet & Ibuprofen. She will follow up in the clinic in 3-4 weeks for postoperative evaluation. ? ? ?Nettie Elm, MD, FACOG ?Attending Obstetrician & Gynecologist ?Faculty Practice, [facility]  ?

## 2022-02-02 NOTE — Interval H&P Note (Signed)
History and Physical Interval Note: ? ?02/02/2022 ?1:03 PM ? ?Barbara Cox  has presented today for surgery, with the diagnosis of UNDESIRED FERTILITY.  The various methods of treatment have been discussed with the patient and family. After consideration of risks, benefits and other options for treatment, the patient has consented to  Procedure(s): ?LAPAROSCOPIC BILATERAL SALPINGECTOMY (Bilateral) as a surgical intervention.  The patient's history has been reviewed, patient examined, no change in status, stable for surgery.  I have reviewed the patient's chart and labs.  Questions were answered to the patient's satisfaction.   ? ? ?Hermina Staggers ? ? ?

## 2022-02-02 NOTE — Anesthesia Preprocedure Evaluation (Signed)
Anesthesia Evaluation  ?Patient identified by MRN, date of birth, ID band ?Patient awake ? ? ? ?Reviewed: ?Allergy & Precautions, NPO status , Patient's Chart, lab work & pertinent test results ? ?Airway ?Mallampati: I ? ?TM Distance: >3 FB ?Neck ROM: Full ? ? ? Dental ?no notable dental hx. ?(+) Teeth Intact, Dental Advisory Given ?  ?Pulmonary ?neg pulmonary ROS, former smoker,  ?  ?Pulmonary exam normal ?breath sounds clear to auscultation ? ? ? ? ? ? Cardiovascular ?negative cardio ROS ?Normal cardiovascular exam ?Rhythm:Regular Rate:Normal ? ? ?  ?Neuro/Psych ?negative psych ROS  ? GI/Hepatic ?negative GI ROS, Neg liver ROS,   ?Endo/Other  ?Obesity ? Renal/GU ?negative Renal ROS  ?negative genitourinary ?  ?Musculoskeletal ?negative musculoskeletal ROS ?(+)  ? Abdominal ?(+) + obese,   ?Peds ? Hematology ? ?(+) Blood dyscrasia, anemia ,   ?Anesthesia Other Findings ? ? Reproductive/Obstetrics ?Undesired fertility ? ?  ? ? ? ? ? ? ? ? ? ? ? ? ? ?  ?  ? ? ? ? ? ? ? ? ?Anesthesia Physical ?Anesthesia Plan ? ?ASA: 2 ? ?Anesthesia Plan: General  ? ?Post-op Pain Management: Dilaudid IV, Ketamine IV* and Precedex  ? ?Induction: Intravenous ? ?PONV Risk Score and Plan: 4 or greater and Treatment may vary due to age or medical condition, Scopolamine patch - Pre-op, Midazolam, Ondansetron and Dexamethasone ? ?Airway Management Planned: Oral ETT ? ?Additional Equipment: None ? ?Intra-op Plan:  ? ?Post-operative Plan: Extubation in OR ? ?Informed Consent: I have reviewed the patients History and Physical, chart, labs and discussed the procedure including the risks, benefits and alternatives for the proposed anesthesia with the patient or authorized representative who has indicated his/her understanding and acceptance.  ? ? ? ?Dental advisory given ? ?Plan Discussed with: CRNA and Anesthesiologist ? ?Anesthesia Plan Comments:   ? ? ? ? ? ? ?Anesthesia Quick Evaluation ? ?

## 2022-02-02 NOTE — Transfer of Care (Signed)
Immediate Anesthesia Transfer of Care Note ? ?Patient: Barbara Cox ? ?Procedure(s) Performed: LAPAROSCOPIC BILATERAL SALPINGECTOMY (Bilateral: Abdomen) ? ?Patient Location: PACU ? ?Anesthesia Type:General ? ?Level of Consciousness: drowsy, patient cooperative and responds to stimulation ? ?Airway & Oxygen Therapy: Patient Spontanous Breathing and Patient connected to nasal cannula oxygen ? ?Post-op Assessment: Report given to RN, Post -op Vital signs reviewed and stable and Patient moving all extremities X 4 ? ?Post vital signs: Reviewed and stable ? ?Last Vitals:  ?Vitals Value Taken Time  ?BP 125/76 02/02/22 1434  ?Temp    ?Pulse 89 02/02/22 1434  ?Resp 11 02/02/22 1434  ?SpO2 100 % 02/02/22 1434  ?Vitals shown include unvalidated device data. ? ?Last Pain:  ?Vitals:  ? 02/02/22 1126  ?TempSrc:   ?PainSc: 0-No pain  ?   ? ?  ? ?Complications: No notable events documented. ?

## 2022-02-03 ENCOUNTER — Encounter (HOSPITAL_COMMUNITY): Payer: Self-pay | Admitting: Obstetrics and Gynecology

## 2022-02-03 LAB — SURGICAL PATHOLOGY

## 2022-02-03 NOTE — Anesthesia Postprocedure Evaluation (Signed)
Anesthesia Post Note ? ?Patient: Barbara Cox ? ?Procedure(s) Performed: LAPAROSCOPIC BILATERAL SALPINGECTOMY (Bilateral: Abdomen) ? ?  ? ?Patient location during evaluation: PACU ?Anesthesia Type: General ?Level of consciousness: awake and alert ?Pain management: pain level controlled ?Vital Signs Assessment: post-procedure vital signs reviewed and stable ?Respiratory status: spontaneous breathing, nonlabored ventilation, respiratory function stable and patient connected to nasal cannula oxygen ?Cardiovascular status: blood pressure returned to baseline and stable ?Postop Assessment: no apparent nausea or vomiting ?Anesthetic complications: no ? ? ?No notable events documented. ? ?Last Vitals:  ?Vitals:  ? 02/02/22 1449 02/02/22 1504  ?BP: (!) 126/91 129/86  ?Pulse: 79 68  ?Resp: (!) 21 20  ?Temp:  36.7 ?C  ?SpO2: 97% 97%  ?  ?Last Pain:  ?Vitals:  ? 02/02/22 1126  ?TempSrc:   ?PainSc: 0-No pain  ? ? ?  ?  ?  ?  ?  ?  ? ?Malissa Slay L [place] ? ? ? ? ?

## 2022-02-09 ENCOUNTER — Encounter: Payer: Self-pay | Admitting: Obstetrics and Gynecology

## 2022-02-09 ENCOUNTER — Other Ambulatory Visit (HOSPITAL_COMMUNITY)
Admission: RE | Admit: 2022-02-09 | Discharge: 2022-02-09 | Disposition: A | Discharge status: Home / Self Care | Payer: Medicaid Other | Source: Ambulatory Visit | Attending: Obstetrics and Gynecology | Admitting: Obstetrics and Gynecology

## 2022-02-09 ENCOUNTER — Ambulatory Visit: Payer: Medicaid Other | Admitting: Obstetrics and Gynecology

## 2022-02-09 VITALS — BP 108/76 | HR 79 | Ht 61.0 in | Wt 197.1 lb

## 2022-02-09 DIAGNOSIS — Z113 Encounter for screening for infections with a predominantly sexual mode of transmission: Secondary | ICD-10-CM | POA: Insufficient documentation

## 2022-02-09 DIAGNOSIS — Z01419 Encounter for gynecological examination (general) (routine) without abnormal findings: Secondary | ICD-10-CM | POA: Diagnosis not present

## 2022-02-09 NOTE — Progress Notes (Signed)
? ? ?GYNECOLOGY ANNUAL PREVENTATIVE CARE ENCOUNTER NOTE ? ?History:    ? Barbara Cox is a 31 y.o. [phone] female here for a routine annual gynecologic exam.  Current complaints: one incident of irregular bleeding from 4/2-4/19 with clots.  Pt received last dose of depo provera in Jan/Feb.  She noted blood with clots in the toilet but never on her pad.  Pt recently had bilateral salpingectomy. ?  ?Gynecologic History ?Patient's last menstrual period was 01/10/2022. ?Contraception: tubal ligation ?Last Pap: 01/13/21. Results were: normal with negative HPV ? ? ?Obstetric History ?OB History  ?Gravida Para Term Preterm AB Living  ?3 2 2   1 2   ?SAB IAB Ectopic Multiple Live Births  ?  1   0 2  ?  ?# Outcome Date GA Lbr Len/2nd Weight Sex Delivery Anes PTL Lv  ?3 Term 12/13/18 [redacted]w[redacted]d 05:49 / 00:02 3.66 kg F Vag-Spont EPI  LIV  ?2 IAB 2018 [redacted]w[redacted]d         ?1 Term 11/09/11   4.082 kg M Vag-Spont   LIV  ? ? ?Past Medical History:  ?Diagnosis Date  ? Anemia   ? Bronchitis   ? Neuromuscular disorder ([place])   ? 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 Thrombocytopenia affecting pregnancy ([place])   ? ? ?Past Surgical History:  ?Procedure Laterality Date  ? LAPAROSCOPIC BILATERAL SALPINGECTOMY Bilateral 02/02/2022  ? Procedure: LAPAROSCOPIC BILATERAL SALPINGECTOMY;  Surgeon: Chancy Milroy, MD;  Location: [facility];  Service: Gynecology;  Laterality: Bilateral;  ? NO PAST SURGERIES    ? ? ?Current Outpatient Medications on File Prior to Visit  ?Medication Sig Dispense Refill  ? ibuprofen (ADVIL) 800 MG tablet Take 1 tablet (800 mg total) by mouth every 8 (eight) hours as needed. 30 tablet 0  ? oxyCODONE (OXY IR/ROXICODONE) 5 MG immediate release tablet Take 1 tablet (5 mg total) by mouth every 6 (six) hours as needed for severe pain. (Patient not taking: Reported on 02/09/2022) 15 tablet 0  ? ?No current facility-administered medications on file prior to visit.  ? ? ?Allergies  ?Allergen Reactions  ? Peanuts [Peanut Oil] Anaphylaxis  ? ? ?Social  History:  reports that she quit smoking about 3 years ago. Her smoking use included cigarettes. She has never used smokeless tobacco. She reports current alcohol use. She reports that she does not use drugs. ? ?Family History  ?Problem Relation Age of Onset  ? Leukemia Father   ? Breast cancer Sister   ? ? ?The following portions of the patient's history were reviewed and updated as appropriate: allergies, current medications, past family history, past medical history, past social history, past surgical history and problem list. ? ?Review of Systems ?Pertinent items noted in HPI and remainder of comprehensive ROS otherwise negative. ? ?Physical Exam:  ?BP 108/76   Pulse 79   Ht 5\' 1"  (1.549 m)   Wt 89.4 kg   LMP 01/10/2022 Comment: Very heavy  BMI 37.24 kg/m?  ?CONSTITUTIONAL: Well-developed, well-nourished female in no acute distress.  ?HENT:  Normocephalic, atraumatic, External right and left ear normal. Oropharynx is clear and moist ?EYES: Conjunctivae and EOM are normal. Pupils are equal, round, and reactive to light. Pt has bilateral scleral bruising R>L, no bruising around the eyes  ?NECK: Normal range of motion, supple, no masses.  Normal thyroid.  ?SKIN: Skin is warm and dry. No rash noted. Not diaphoretic. No erythema. No pallor. ?MUSCULOSKELETAL: Normal range of motion. No tenderness.  No cyanosis, clubbing, or  edema.  2+ distal pulses. ?NEUROLOGIC: Alert and oriented to person, place, and time. Normal reflexes, muscle tone coordination.  ?PSYCHIATRIC: Normal mood and affect. Normal behavior. Normal judgment and thought content. ?CARDIOVASCULAR: Normal heart rate noted, regular rhythm ?RESPIRATORY: Clear to auscultation bilaterally. Effort and breath sounds normal, no problems with respiration noted. ?BREASTS: Symmetric in size. No masses, tenderness, skin changes, nipple drainage, or lymphadenopathy bilaterally. Performed in the presence of a chaperone. ?ABDOMEN: Soft, no distention noted.  No  tenderness, rebound or guarding. Removed honeycomb bandage, dermabond still noted at umbilical incision, left trochar sites clean and dry  ?PELVIC: Normal appearing external genitalia and urethral meatus; normal appearing vaginal mucosa and cervix.  No abnormal discharge noted.  Vaginal swab obtained.  Normal uterine size, no other palpable masses, no uterine or adnexal tenderness.  Performed in the presence of a chaperone. ?  ?Assessment and Plan:  ?  1. Women's annual routine gynecological examination ?Normal annual exam, advised patient to allow expectant management regarding menses for 2-3 months.  If it continues to be abnormal will start evaluation at that time. ? ?2. Routine screening for STI (sexually transmitted infection) ?Pt request ?- Cervicovaginal ancillary only ?- Hepatitis B surface antigen ?- Hepatitis C antibody ?- RPR ?- HIV Antibody (routine testing w rflx) ? ?Routine preventative health maintenance measures emphasized. ?Please refer to After Visit Summary for other counseling recommendations.  ?  F/u as scheduled for post op visit in 1-2 weeks ? ?Lynnda Shields, MD, FACOG ?Obstetrician Social research officer, government, Faculty Practice ?[facility]  ?

## 2022-02-09 NOTE — Progress Notes (Signed)
Pt in office for annual exam. Pt is requesting STD testing and needs a medical release to return to work of there tubal on 02/02/22.  ?

## 2022-02-10 LAB — CERVICOVAGINAL ANCILLARY ONLY
Bacterial Vaginitis (gardnerella): NEGATIVE
Candida Glabrata: NEGATIVE
Candida Vaginitis: NEGATIVE
Chlamydia: NEGATIVE
Comment: NEGATIVE
Comment: NEGATIVE
Comment: NEGATIVE
Comment: NEGATIVE
Comment: NEGATIVE
Comment: NORMAL
Neisseria Gonorrhea: NEGATIVE
Trichomonas: NEGATIVE

## 2022-02-10 LAB — HEPATITIS B SURFACE ANTIGEN: Hepatitis B Surface Ag: NEGATIVE

## 2022-02-10 LAB — HIV ANTIBODY (ROUTINE TESTING W REFLEX): HIV Screen 4th Generation wRfx: NONREACTIVE

## 2022-02-10 LAB — RPR: RPR Ser Ql: NONREACTIVE

## 2022-02-10 LAB — HEPATITIS C ANTIBODY: Hep C Virus Ab: NONREACTIVE

## 2022-02-25 ENCOUNTER — Encounter: Payer: Self-pay | Admitting: Obstetrics and Gynecology

## 2022-02-25 ENCOUNTER — Ambulatory Visit (INDEPENDENT_AMBULATORY_CARE_PROVIDER_SITE_OTHER): Payer: Medicaid Other | Admitting: Obstetrics and Gynecology

## 2022-02-25 DIAGNOSIS — Z9889 Other specified postprocedural states: Secondary | ICD-10-CM

## 2022-02-25 DIAGNOSIS — Z9079 Acquired absence of other genital organ(s): Secondary | ICD-10-CM | POA: Insufficient documentation

## 2022-02-25 NOTE — Progress Notes (Signed)
Barbara Cox presents for post op appt. She has no complaints today Denies any bowel or bladder dysfunction Pathology reviewed with pt.   PE AF VSS Lungs clear Heart RRR Abd soft + BS incisions well healed  A/P Post op, Lap bilateral salpingectomy  Doing well. Return to ADL's as tolerates F/U PRN

## 2022-02-25 NOTE — Patient Instructions (Signed)

## 2022-04-28 ENCOUNTER — Other Ambulatory Visit (HOSPITAL_COMMUNITY)
Admission: RE | Admit: 2022-04-28 | Discharge: 2022-04-28 | Disposition: A | Discharge status: Home / Self Care | Payer: Medicaid Other | Source: Ambulatory Visit | Attending: Obstetrics & Gynecology | Admitting: Obstetrics & Gynecology

## 2022-04-28 ENCOUNTER — Ambulatory Visit (INDEPENDENT_AMBULATORY_CARE_PROVIDER_SITE_OTHER): Payer: Medicaid Other

## 2022-04-28 DIAGNOSIS — N898 Other specified noninflammatory disorders of vagina: Secondary | ICD-10-CM

## 2022-04-28 NOTE — Progress Notes (Signed)
..  SUBJECTIVE:  31 y.o. female complains of  vaginal discharge for 1 week(s). Denies abnormal vaginal bleeding or significant pelvic pain or fever. No UTI symptoms. Reports new partner; Denies history of known exposure to STD.  No LMP recorded. Patient has had an injection.  OBJECTIVE:  She appears well, afebrile. Urine dipstick: not done.  ASSESSMENT:  Vaginal Discharge  New Partner   PLAN:  GC, chlamydia, trichomonas, BVAG, CVAG probe sent to lab. Treatment: To be determined once lab results are received ROV prn if symptoms persist or worsen.

## 2022-04-29 LAB — CERVICOVAGINAL ANCILLARY ONLY
Bacterial Vaginitis (gardnerella): POSITIVE — AB
Candida Glabrata: NEGATIVE
Candida Vaginitis: NEGATIVE
Chlamydia: NEGATIVE
Comment: NEGATIVE
Comment: NEGATIVE
Comment: NEGATIVE
Comment: NEGATIVE
Comment: NEGATIVE
Comment: NORMAL
Neisseria Gonorrhea: NEGATIVE
Trichomonas: NEGATIVE

## 2022-04-30 ENCOUNTER — Other Ambulatory Visit: Payer: Self-pay | Admitting: Obstetrics & Gynecology

## 2022-04-30 DIAGNOSIS — N898 Other specified noninflammatory disorders of vagina: Secondary | ICD-10-CM

## 2022-04-30 MED ORDER — METRONIDAZOLE 500 MG PO TABS: 500.0000 mg | ORAL_TABLET | Freq: Two times a day (BID) | ORAL | 0 refills | Status: DC

## 2022-05-27 DIAGNOSIS — Z0389 Encounter for observation for other suspected diseases and conditions ruled out: Secondary | ICD-10-CM | POA: Diagnosis not present

## 2022-05-27 DIAGNOSIS — Z113 Encounter for screening for infections with a predominantly sexual mode of transmission: Secondary | ICD-10-CM | POA: Diagnosis not present

## 2022-05-27 DIAGNOSIS — Z202 Contact with and (suspected) exposure to infections with a predominantly sexual mode of transmission: Secondary | ICD-10-CM | POA: Diagnosis not present

## 2022-09-14 ENCOUNTER — Ambulatory Visit: Payer: Medicaid Other

## 2022-10-12 ENCOUNTER — Ambulatory Visit: Payer: Medicaid Other | Admitting: Obstetrics

## 2022-11-17 ENCOUNTER — Other Ambulatory Visit (HOSPITAL_COMMUNITY)
Admission: RE | Admit: 2022-11-17 | Discharge: 2022-11-17 | Disposition: A | Discharge status: Home / Self Care | Payer: Medicaid Other | Source: Ambulatory Visit | Attending: Obstetrics | Admitting: Obstetrics

## 2022-11-17 ENCOUNTER — Encounter: Payer: Self-pay | Admitting: Obstetrics

## 2022-11-17 ENCOUNTER — Ambulatory Visit (INDEPENDENT_AMBULATORY_CARE_PROVIDER_SITE_OTHER): Payer: Medicaid Other | Admitting: Obstetrics

## 2022-11-17 VITALS — BP 113/73 | HR 75 | Ht 62.0 in | Wt 200.4 lb

## 2022-11-17 DIAGNOSIS — R52 Pain, unspecified: Secondary | ICD-10-CM | POA: Diagnosis not present

## 2022-11-17 DIAGNOSIS — N898 Other specified noninflammatory disorders of vagina: Secondary | ICD-10-CM

## 2022-11-17 DIAGNOSIS — Z113 Encounter for screening for infections with a predominantly sexual mode of transmission: Secondary | ICD-10-CM

## 2022-11-17 DIAGNOSIS — E569 Vitamin deficiency, unspecified: Secondary | ICD-10-CM

## 2022-11-17 DIAGNOSIS — Z01419 Encounter for gynecological examination (general) (routine) without abnormal findings: Secondary | ICD-10-CM | POA: Diagnosis not present

## 2022-11-17 MED ORDER — VITAFOL ULTRA 29-0.6-0.4-200 MG PO CAPS: 1.0000 | ORAL_CAPSULE | Freq: Every day | ORAL | 11 refills | Status: AC

## 2022-11-17 MED ORDER — IBUPROFEN 800 MG PO TABS: 800.0000 mg | ORAL_TABLET | Freq: Three times a day (TID) | ORAL | 5 refills | Status: AC | PRN

## 2022-11-17 NOTE — Progress Notes (Signed)
Patient presents for AEX. Patient desires to have STD testing, yeast, and bv. No other concerns .   Last Pap: 01/13/21 Normal

## 2022-11-17 NOTE — Progress Notes (Signed)
Subjective:        Barbara Cox is a 32 y.o. female here for a routine exam.  Current complaints: Vaginal discharge.    Personal health questionnaire:  Is patient Ashkenazi Jewish, have a family history of breast and/or ovarian cancer: no Is there a family history of uterine cancer diagnosed at age < 42, gastrointestinal cancer, urinary tract cancer, family member who is a Field seismologist syndrome-associated carrier: no Is the patient overweight and hypertensive, family history of diabetes, personal history of gestational diabetes, preeclampsia or PCOS: no Is patient over 61, have PCOS,  family history of premature CHD under age 69, diabetes, smoke, have hypertension or peripheral artery disease:  no At any time, has a partner hit, kicked or otherwise hurt or frightened you?: no Over the past 2 weeks, have you felt down, depressed or hopeless?: no Over the past 2 weeks, have you felt little interest or pleasure in doing things?:no   Gynecologic History Patient's last menstrual period was 11/11/2022. Contraception: tubal ligation Last Pap: 2022. Results were: normal Last mammogram: n/a. 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   Thrombocytopenia affecting pregnancy [facility])     Past Surgical History:  Procedure Laterality Date   LAPAROSCOPIC BILATERAL SALPINGECTOMY Bilateral 02/02/2022   Procedure: LAPAROSCOPIC BILATERAL SALPINGECTOMY;  Surgeon: Chancy Milroy, MD;  Location: [place];  Service: Gynecology;  Laterality: Bilateral;    NO PAST SURGERIES       Current Outpatient Medications:    Prenat-Fe Poly-Methfol-FA-DHA (VITAFOL ULTRA) 29-0.6-0.4-200 MG CAPS, Take 1 capsule by mouth daily before breakfast., Disp: 30 capsule, Rfl: 11   ibuprofen (ADVIL) 800 MG tablet, Take 1 tablet (800 mg total) by mouth every 8 (eight) hours as needed., Disp: 30 tablet, Rfl: 5   metroNIDAZOLE (FLAGYL) 500 MG tablet, Take 1 tablet (500 mg total) by mouth 2 (two) times daily., Disp: 14 tablet, Rfl: 0 Allergies  Allergen Reactions   Peanuts [Peanut Oil] Anaphylaxis    Social History   Tobacco Use   Smoking status: Former    Types: Cigarettes    Quit date: 07/20/2018    Years since quitting: 4.3   Smokeless tobacco: Never   Tobacco comments:    occasional (5 in the past week)  Substance Use Topics   Alcohol use: Yes    Comment: occasional    Family History  Problem Relation Age of Onset   Leukemia Father    Breast cancer Sister       Review of Systems  Constitutional: negative for fatigue and weight loss Respiratory: negative for cough and wheezing Cardiovascular: negative for chest pain, fatigue and palpitations Gastrointestinal: negative for abdominal pain and change in bowel habits Musculoskeletal:negative for myalgias Neurological: negative for gait problems and tremors Behavioral/Psych: negative for abusive relationship, depression Endocrine: negative  for temperature intolerance    Genitourinary: posi tive for vaginal discharge.  negative for abnormal menstrual periods, genital lesions, hot flashes, sexual problems  Integument/breast: negative for breast lump, breast tenderness, nipple discharge and skin lesion(s)    Objective:       BP 113/73   Pulse 75   Ht 5\' 2"  (1.575 m)   Wt 200 lb 6.4 oz (90.9 kg)   LMP 11/11/2022   BMI 36.65 kg/m  General:   Alert and no distress  Skin:   no rash or abnormalities  Lungs:   clear to auscultation bilaterally  Heart:   regular rate and rhythm, S1, S2 normal, no  murmur, click, rub or gallop  Breasts:   normal without suspicious masses, skin or nipple changes or axillary nodes  Abdomen:  normal findings: no organomegaly, soft, non-tender and no hernia  Pelvis:  External genitalia: normal general appearance Urinary system: urethral meatus normal and bladder without fullness, nontender Vaginal: normal without tenderness, induration or masses Cervix: normal appearance Adnexa: normal bimanual exam Uterus: anteverted and non-tender, normal size   Lab Review Urine pregnancy test Labs reviewed yes Radiologic studies reviewed no  I have spent a total of 20 minutes of face-to-face time, excluding clinical staff time, reviewing notes and preparing to see patient, ordering tests and/or medications, and counseling the patient.   Assessment:    Healthy female exam.    Plan:    Education reviewed: calcium supplements, depression evaluation, low fat, low cholesterol diet, safe sex/STD prevention, self breast exams, and weight bearing exercise. Follow up in: 1 year.   Meds ordered this encounter  Medications   Prenat-Fe Poly-Methfol-FA-DHA (VITAFOL ULTRA) 29-0.6-0.4-200 MG CAPS    Sig: Take 1 capsule by mouth daily before breakfast.    Dispense:  30 capsule    Refill:  11   ibuprofen (ADVIL) 800 MG tablet    Sig: Take 1 tablet (800 mg total) by mouth every 8 (eight) hours as needed.    Dispense:  30 tablet    Refill:  5   Orders Placed This Encounter  Procedures   Hepatitis B surface antigen   Hepatitis C antibody   HIV Antibody (routine testing w rflx)   RPR     Shelly Bombard, MD 11/17/2022 3:05 PM

## 2022-11-18 LAB — CERVICOVAGINAL ANCILLARY ONLY
Bacterial Vaginitis (gardnerella): POSITIVE — AB
Candida Glabrata: NEGATIVE
Candida Vaginitis: NEGATIVE
Chlamydia: NEGATIVE
Comment: NEGATIVE
Comment: NEGATIVE
Comment: NEGATIVE
Comment: NEGATIVE
Comment: NEGATIVE
Comment: NORMAL
Neisseria Gonorrhea: POSITIVE — AB
Trichomonas: NEGATIVE

## 2022-11-18 LAB — HIV ANTIBODY (ROUTINE TESTING W REFLEX): HIV Screen 4th Generation wRfx: NONREACTIVE

## 2022-11-18 LAB — RPR: RPR Ser Ql: NONREACTIVE

## 2022-11-18 LAB — HEPATITIS C ANTIBODY: Hep C Virus Ab: NONREACTIVE

## 2022-11-18 LAB — HEPATITIS B SURFACE ANTIGEN: Hepatitis B Surface Ag: NEGATIVE

## 2022-11-19 ENCOUNTER — Ambulatory Visit (INDEPENDENT_AMBULATORY_CARE_PROVIDER_SITE_OTHER): Payer: Medicaid Other

## 2022-11-19 DIAGNOSIS — A5402 Gonococcal vulvovaginitis, unspecified: Secondary | ICD-10-CM

## 2022-11-19 DIAGNOSIS — A549 Gonococcal infection, unspecified: Secondary | ICD-10-CM

## 2022-11-19 MED ORDER — CEFTRIAXONE SODIUM 500 MG IJ SOLR
500.0000 mg | Freq: Once | INTRAMUSCULAR | Status: AC
  Administered 2022-11-19: 500 mg via INTRAMUSCULAR

## 2022-11-19 NOTE — Progress Notes (Signed)
Patient presents for rocephin inj for positive Gonorrhea on 11/17/22. Patient advised to inform partner of positive results and the need for treatment. Advised to abstain from sexual intercourse for at least a week after she and her partner has completed treatment. Patient verbalized understanding. [facility] for faxed.  500 mg of ceftriaxone reconstituted with 1 ml 1% lidocaine. Given in [place]. Patient tolerated well.

## 2022-11-24 ENCOUNTER — Other Ambulatory Visit: Payer: Self-pay | Admitting: Obstetrics

## 2022-11-24 DIAGNOSIS — N898 Other specified noninflammatory disorders of vagina: Secondary | ICD-10-CM

## 2022-11-24 DIAGNOSIS — A549 Gonococcal infection, unspecified: Secondary | ICD-10-CM

## 2022-11-24 LAB — CYTOLOGY - PAP
Comment: NEGATIVE
Diagnosis: UNDETERMINED — AB
High risk HPV: NEGATIVE

## 2022-11-24 MED ORDER — METRONIDAZOLE 500 MG PO TABS: 500.0000 mg | ORAL_TABLET | Freq: Two times a day (BID) | ORAL | 0 refills | Status: DC

## 2022-11-24 MED ORDER — CEFTRIAXONE SODIUM 500 MG IJ SOLR: 500.0000 mg | Freq: Once | INTRAMUSCULAR | Status: AC

## 2022-12-08 ENCOUNTER — Ambulatory Visit (INDEPENDENT_AMBULATORY_CARE_PROVIDER_SITE_OTHER): Payer: Medicaid Other

## 2022-12-08 DIAGNOSIS — Z1339 Encounter for screening examination for other mental health and behavioral disorders: Secondary | ICD-10-CM | POA: Diagnosis not present

## 2022-12-08 DIAGNOSIS — A5402 Gonococcal vulvovaginitis, unspecified: Secondary | ICD-10-CM | POA: Diagnosis not present

## 2022-12-08 DIAGNOSIS — Z202 Contact with and (suspected) exposure to infections with a predominantly sexual mode of transmission: Secondary | ICD-10-CM

## 2022-12-08 MED ORDER — CEFTRIAXONE SODIUM 500 MG IJ SOLR
500.0000 mg | Freq: Once | INTRAMUSCULAR | Status: AC
  Administered 2022-12-08: 500 mg via INTRAMUSCULAR

## 2022-12-08 NOTE — Progress Notes (Signed)
Patient present for re-treatment for gonorrhea due to reexposure to untreated partner. Advised to abstain from sexual intercourse for at least a week after she and her partner has completed treatment. Patient verbalized understanding.    500 mg of ceftriaxone reconstituted with 1 ml 1% lidocaine. Given in [place]. Patient tolerated well.

## 2023-02-16 ENCOUNTER — Other Ambulatory Visit (HOSPITAL_COMMUNITY)
Admission: RE | Admit: 2023-02-16 | Discharge: 2023-02-16 | Disposition: A | Discharge status: Home / Self Care | Payer: Medicaid Other | Source: Ambulatory Visit | Attending: Obstetrics | Admitting: Obstetrics

## 2023-02-16 ENCOUNTER — Encounter: Payer: Self-pay | Admitting: Obstetrics

## 2023-02-16 ENCOUNTER — Ambulatory Visit: Payer: Medicaid Other | Admitting: Obstetrics

## 2023-02-16 VITALS — BP 110/65 | HR 85 | Ht 61.0 in | Wt 199.5 lb

## 2023-02-16 DIAGNOSIS — Z3169 Encounter for other general counseling and advice on procreation: Secondary | ICD-10-CM

## 2023-02-16 DIAGNOSIS — Z113 Encounter for screening for infections with a predominantly sexual mode of transmission: Secondary | ICD-10-CM | POA: Diagnosis not present

## 2023-02-16 DIAGNOSIS — N971 Female infertility of tubal origin: Secondary | ICD-10-CM

## 2023-02-16 DIAGNOSIS — N898 Other specified noninflammatory disorders of vagina: Secondary | ICD-10-CM

## 2023-02-16 DIAGNOSIS — Z9079 Acquired absence of other genital organ(s): Secondary | ICD-10-CM | POA: Diagnosis not present

## 2023-02-16 NOTE — Progress Notes (Signed)
Desires STD testing. Denies symptoms, just wants routine checkup. Declines blood work.

## 2023-02-16 NOTE — Progress Notes (Signed)
Patient ID: Barbara Cox, female   DOB: Jan 04, 1991, 32 y.o.   MRN:   HPI Barbara Cox is a 32 y.o. female.  Complains of vaginal discharge.  Wants to conceive again, but had a bilateral salpingectomy done after last pregnancy. She wants to discuss options. 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   Thrombocytopenia affecting pregnancy [facility])     Past Surgical History:  Procedure Laterality Date   LAPAROSCOPIC BILATERAL SALPINGECTOMY Bilateral 02/02/2022   Procedure: LAPAROSCOPIC BILATERAL SALPINGECTOMY;  Surgeon: Hermina Staggers, MD;  Location[facility];  Service: Gynecology;  Laterality: Bilateral;   NO PAST SURGERIES      Family History  Problem Relation Age of Onset   Leukemia Father    Breast cancer Sister     Social History Social History   Tobacco Use   Smoking status: Former    Types: Cigarettes    Quit date: 07/20/2018    Years since quitting: 4.5   Smokeless tobacco: Never   Tobacco comments:    occasional (5 in the past week)  Vaping Use   Vaping Use: Every day   Substances: Nicotine, Flavoring  Substance Use Topics   Alcohol use: Yes    Comment: occasional   Drug use: Never    Allergies  Allergen Reactions   Peanuts [Peanut Oil] Anaphylaxis    Current Outpatient Medications  Medication Sig Dispense Refill   ibuprofen (ADVIL) 800 MG tablet Take 1 tablet (800 mg total) by mouth every 8 (eight) hours as needed. 30 tablet 5   metroNIDAZOLE (FLAGYL) 500 MG tablet Take 1 tablet (500 mg total) by mouth 2 (two) times daily. 14 tablet 0   Prenat-Fe Poly-Methfol-FA-DHA (VITAFOL ULTRA) 29-0.6-0.4-200 MG CAPS Take 1 capsule by mouth daily before breakfast. 30 capsule 11   Current Facility-Administered Medications  Medication Dose Route Frequency Provider Last Rate Last Admin   cefTRIAXone (ROCEPHIN) injection 500 mg  500 mg Intramuscular Once Brock Bad, MD         Review of Systems Review of Systems Constitutional: negative for fatigue and weight loss Respiratory: negative for cough and wheezing Cardiovascular: negative for chest pain, fatigue and palpitations Gastrointestinal: negative for abdominal pain and change in bowel habits Genitourinary: positive for vaginal discharge Integument/breast: negative for nipple discharge Musculoskeletal:negative for myalgias Neurological: negative for gait problems and tremors Behavioral/Psych: negative for abusive relationship, depression Endocrine: negative for temperature intolerance      Blood pressure 110/65, pulse 85, height 5\' 1"  (1.549 m), weight 199 lb 8 oz (90.5 kg), last menstrual period 01/29/2023.  Physical Exam Physical Exam General:   Alert and no distress  Skin:   no rash or abnormalities  Lungs:   clear to auscultation bilaterally  Heart:   regular rate and rhythm, S1, S2 normal, no murmur, click, rub or gallop  Breasts:   normal without suspicious masses, skin or nipple changes or axillary nodes  Abdomen:  normal findings: no organomegaly, soft, non-tender and no hernia  Pelvis:  External genitalia: normal general appearance Urinary system: urethral meatus normal and bladder without fullness, nontender Vaginal: normal without tenderness, induration or masses Cervix: normal appearance Adnexa: normal bimanual exam Uterus: anteverted and non-tender, normal size    I have spent a total of 20 minutes of face-to-face time, excluding clinical staff time, reviewing notes and preparing to see patient, ordering tests and/or medications, and  counseling the patient.   Data Reviewed Operative Notes Labs  Assessment     1. Vaginal discharge Rx: - Cervicovaginal ancillary only( [place]) - Ambulatory referral to Infertility  2. Screen for STD (sexually transmitted disease) Rx: - Cervicovaginal ancillary only( [place])  3. History of bilateral salpingectomy  4. Infertility of  tubal origin Rx: - Ambulatory referral to Infertility  5. Infertility counseling - IVF discussed  6. Encounter for preconception consultation - importance of folic acid, healthy diet and healthy living discussed     Plan   Follow up prn  Orders Placed This Encounter  Procedures   Ambulatory referral to Infertility    Referral Priority:   Routine    Referral Type:   Consultation    Referral Reason:   Specialty Services Required    Requested Specialty:   Obstetrics    Number of Visits Requested:   1    Brock Bad, MD 02/16/2023 3:30 PM

## 2023-02-17 LAB — CERVICOVAGINAL ANCILLARY ONLY
Bacterial Vaginitis (gardnerella): POSITIVE — AB
Candida Glabrata: NEGATIVE
Candida Vaginitis: NEGATIVE
Chlamydia: NEGATIVE
Comment: NEGATIVE
Comment: NEGATIVE
Comment: NEGATIVE
Comment: NEGATIVE
Comment: NEGATIVE
Comment: NORMAL
Neisseria Gonorrhea: NEGATIVE
Trichomonas: NEGATIVE

## 2023-02-18 ENCOUNTER — Other Ambulatory Visit: Payer: Self-pay | Admitting: Obstetrics

## 2023-02-18 DIAGNOSIS — N898 Other specified noninflammatory disorders of vagina: Secondary | ICD-10-CM

## 2023-02-18 MED ORDER — METRONIDAZOLE 500 MG PO TABS: 500.0000 mg | ORAL_TABLET | Freq: Two times a day (BID) | ORAL | 0 refills | Status: AC

## 2023-02-18 NOTE — Progress Notes (Signed)
[facility] message sent. Advised of DX and RX sent. Education included in message.

## 2023-04-19 ENCOUNTER — Emergency Department (HOSPITAL_BASED_OUTPATIENT_CLINIC_OR_DEPARTMENT_OTHER): Payer: Medicaid Other

## 2023-04-19 ENCOUNTER — Encounter (HOSPITAL_COMMUNITY): Payer: Self-pay

## 2023-04-19 ENCOUNTER — Emergency Department (HOSPITAL_COMMUNITY)
Admission: EM | Admit: 2023-04-19 | Discharge: 2023-04-19 | Disposition: A | Discharge status: Home / Self Care | Payer: Medicaid Other | Source: Home / Self Care | Attending: Emergency Medicine | Admitting: Emergency Medicine

## 2023-04-19 ENCOUNTER — Emergency Department (HOSPITAL_COMMUNITY): Payer: Medicaid Other

## 2023-04-19 DIAGNOSIS — Z91010 Allergy to peanuts: Secondary | ICD-10-CM | POA: Diagnosis not present

## 2023-04-19 DIAGNOSIS — R2241 Localized swelling, mass and lump, right lower limb: Secondary | ICD-10-CM | POA: Insufficient documentation

## 2023-04-19 DIAGNOSIS — M79604 Pain in right leg: Secondary | ICD-10-CM | POA: Insufficient documentation

## 2023-04-19 DIAGNOSIS — M25561 Pain in right knee: Secondary | ICD-10-CM | POA: Diagnosis not present

## 2023-04-19 DIAGNOSIS — M7989 Other specified soft tissue disorders: Secondary | ICD-10-CM

## 2023-04-19 LAB — BASIC METABOLIC PANEL
Anion gap: 6 (ref 5–15)
BUN: 10 mg/dL (ref 6–20)
CO2: 24 mmol/L (ref 22–32)
Calcium: 8 mg/dL — ABNORMAL LOW (ref 8.9–10.3)
Chloride: 108 mmol/L (ref 98–111)
Creatinine, Ser: 0.84 mg/dL (ref 0.44–1.00)
GFR, Estimated: >60 mL/min (ref >60)
Glucose, Bld: 90 mg/dL (ref 70–99)
Potassium: 3.4 mmol/L — ABNORMAL LOW (ref 3.5–5.1)
Sodium: 138 mmol/L (ref 135–145)

## 2023-04-19 LAB — CBC
HCT: 38.8 % (ref 36.0–46.0)
Hemoglobin: 11.9 g/dL — ABNORMAL LOW (ref 12.0–15.0)
MCH: 27.9 pg (ref 26.0–34.0)
MCHC: 30.7 g/dL (ref 30.0–36.0)
MCV: 91.1 fL (ref 80.0–100.0)
Platelets: 155 10*3/uL (ref 150–400)
RBC: 4.26 MIL/uL (ref 3.87–5.11)
RDW: 14.2 % (ref 11.5–15.5)
WBC: 3.9 10*3/uL — ABNORMAL LOW (ref 4.0–10.5)
nRBC: 0 % (ref 0.0–0.2)

## 2023-04-19 NOTE — ED Provider Notes (Signed)
[facility] AT [facility] Provider Note   CSN: [id number] Arrival date & time: 04/19/23  1059     History  Chief Complaint  Patient presents with   Leg Pain    Barbara Cox is a 32 y.o. female with right leg pain for 1 day. States she has increased pain and swelling in the whole right leg including her thigh, knee, and calf. States her leg feels "fuzzy". Right knee hurts the most. Denies any injuries to the area. Denies any periods of immobilizations, long plane rides, recent surgery, OCP use. 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 Denies any fevers or chills at home, denies any wounds to the leg, denies any concern for STIs, abnormal vaginal discharge.    Leg Pain      Home Medications Prior to Admission medications   Medication Sig Start Date End Date Taking? Authorizing Provider  ibuprofen (ADVIL) 800 MG tablet Take 1 tablet (800 mg total) by mouth every 8 (eight) hours as needed. 11/17/22   Brock Bad, MD  metroNIDAZOLE (FLAGYL) 500 MG tablet Take 1 tablet (500 mg total) by mouth 2 (two) times daily. 02/18/23   Brock Bad, MD  Prenat-Fe Poly-Methfol-FA-DHA (VITAFOL ULTRA) 29-0.6-0.4-200 MG CAPS Take 1 capsule by mouth daily before breakfast. 11/17/22   Brock Bad, MD      Allergies    Peanuts [peanut oil]    Review of Systems   Review of Systems  Musculoskeletal:        Right leg pain    Physical Exam Updated Vital Signs BP (!) 132/98 (BP Location: Left Arm)   Pulse 70   Temp 98.9 F (37.2 C) (Oral)   Resp 16   Ht 4\' 11"  (1.499 m)   Wt 91.2 kg   SpO2 100%   BMI 40.60 kg/m  Physical Exam Vitals and nursing note reviewed.  Constitutional:      General: She is not in acute distress.    Comments: Uncomfortable appearing, crying when right leg is pressed on.   Cardiovascular:     Rate and Rhythm: Normal rate and regular rhythm.     Heart sounds: No murmur heard.    Comments: 2+  dorsalis pedis pulses bilaterally Pulmonary:     Effort: Pulmonary effort is normal. No respiratory distress.     Breath sounds: Normal breath sounds.  Abdominal:     General: Abdomen is flat.     Palpations: Abdomen is soft.     Tenderness: There is no abdominal tenderness.  Musculoskeletal:     Cervical back: Normal range of motion.     Comments: Right and left lower extremities have appropriate and equal ROM at the hip, knee, and ankle. No obvious deformity  Diffuse tenderness to palpation along the distal right thigh, right knee, and right calf.  Mild edema of the right foot No right knee effusion  Skin:    General: Skin is warm and dry.     Comments: No obvious erythema, rashes, wounds  Neurological:     General: No focal deficit present.     Mental Status: She is alert.  Psychiatric:        Mood and Affect: Mood normal.     ED Results / Procedures / Treatments   Labs (all labs ordered are listed, but only abnormal results are displayed) Labs Reviewed  CBC - Abnormal; Notable for the following components:  Result Value   WBC 3.9 (*)    Hemoglobin 11.9 (*)    All other components within normal limits  BASIC METABOLIC PANEL - Abnormal; Notable for the following components:   Potassium 3.4 (*)    Calcium 8.0 (*)    All other components within normal limits    EKG None  Radiology DG Knee Complete 4 Views Right  Result Date: 04/19/2023 CLINICAL DATA:  Knee pain, no known injury EXAM: RIGHT KNEE - COMPLETE 4 VIEW COMPARISON:  None Available. FINDINGS: No evidence of fracture, dislocation, or joint effusion. No evidence of arthropathy or other focal bone abnormality. Soft tissues are unremarkable. IMPRESSION: Negative. Electronically Signed   By: Jacob Moores M.D.   On: 04/19/2023 12:37    Procedures Procedures    Medications Ordered in ED Medications - No data to display  ED Course/ Medical Decision Making/ A&P                             Medical  Decision Making Amount and/or Complexity of Data Reviewed Labs: ordered. Radiology: ordered.   32 y.o. female presents to the ED for concern of right leg pain for 1 day   Differential diagnosis includes but is not limited to complex regional pain syndrome, trauma to the area, septic arthritis, DVT, cellulitis  Patient denies any injury to the leg, makes fracture, dislocation, or CRPS less likely Knee with no erythema or effusion, no fevers or chills, do not suspect septic arthritis No wounds or rashes, low suspicion for cellulitis  Patient has long periods of time in the car, could consider DVT.   ED Course:  Patient with diffuse pain in her right lower extremity for 1 day. Able to ambulate with pain. ROM normal. No frank edema or erythema. No effusions of the knee. No trauma to the area, x-ray of the right knee with no abnormalities. CBC with no leukocytosis, BMP unremarkable. Ultrasound with no evidence of DVT  Impression: Right leg pain  Disposition:  The patient was discharged home with instructions to make an appointment as soon as possible with her orthopedic office she is established at for further evaluation of her right leg/knee pain. She can take ibuprofen as needed for pain.  Return precautions given.  Lab Tests: I Ordered, and personally interpreted labs.  The pertinent results include:   CBC with no leukocytosis BMP with no concerning findings   Imaging Studies ordered: I ordered imaging studies including Venous US RLE I independently visualized the imaging with scope of interpretation limited to determining acute life threatening conditions related to emergency care.  US Imaging showed no concern for DVT  X ray right knee with no dislocations, fractures, soft tissue abnormalities I agree with the radiologist interpretation   Cardiac Monitoring: / EKG: Not indicated   Consultations Obtained: Not indicated   Co morbidities that complicate the patient  evaluation  None  Social Determinants of Health:  unknown              Final Clinical Impression(s) / ED Diagnoses Final diagnoses:  Right leg pain    Rx / DC Orders ED Discharge Orders     None         Arabella Merles, PA-C 04/19/23 1411    Rolan Bucco, MD 04/19/23 1426

## 2023-04-19 NOTE — Discharge Instructions (Signed)
Your X ray today showed no concern for fracture or dislocations.  Your ultrasound showed no concern for DVT (a clot in your leg).  You may do activities as tolerated. Refrain from activities that are painful.   Please make an appointment as soon as possible with your orthopedic team. Contact information provided.   You may use up to 800mg  ibuprofen every 6 hours as needed for pain.   Return to the ER if you are unable to walk on your leg, you develop fever, chills, numbness in the leg, your leg becomes cold or changes color, shortness of breath.

## 2023-04-19 NOTE — ED Triage Notes (Signed)
Pt arrived via POV, c/o right leg pain and swelling. Denies any injury.

## 2023-04-19 NOTE — Progress Notes (Signed)
Lower extremity venous right study completed.  Preliminary results relayed to [place], [place].  See CV Proc for preliminary results report.   Jean Rosenthal, RDMS, RVT

## 2023-05-23 ENCOUNTER — Ambulatory Visit: Payer: Medicaid Other

## 2023-05-23 DIAGNOSIS — Z113 Encounter for screening for infections with a predominantly sexual mode of transmission: Secondary | ICD-10-CM

## 2023-08-04 ENCOUNTER — Other Ambulatory Visit (HOSPITAL_COMMUNITY)
Admission: RE | Admit: 2023-08-04 | Discharge: 2023-08-04 | Disposition: A | Discharge status: Home / Self Care | Payer: Medicaid Other | Source: Ambulatory Visit | Attending: Obstetrics & Gynecology | Admitting: Obstetrics & Gynecology

## 2023-08-04 ENCOUNTER — Ambulatory Visit (INDEPENDENT_AMBULATORY_CARE_PROVIDER_SITE_OTHER): Payer: Medicaid Other

## 2023-08-04 ENCOUNTER — Encounter: Payer: Self-pay | Admitting: Obstetrics & Gynecology

## 2023-08-04 DIAGNOSIS — N898 Other specified noninflammatory disorders of vagina: Secondary | ICD-10-CM | POA: Diagnosis not present

## 2023-08-04 DIAGNOSIS — S00521A Blister (nonthermal) of lip, initial encounter: Secondary | ICD-10-CM

## 2023-08-04 DIAGNOSIS — B9689 Other specified bacterial agents as the cause of diseases classified elsewhere: Secondary | ICD-10-CM

## 2023-08-04 DIAGNOSIS — B009 Herpesviral infection, unspecified: Secondary | ICD-10-CM

## 2023-08-04 MED ORDER — VALACYCLOVIR HCL 1 G PO TABS
1000.0000 mg | ORAL_TABLET | Freq: Two times a day (BID) | ORAL | 3 refills | Status: AC
Start: 2023-08-04 — End: ?

## 2023-08-04 NOTE — Addendum Note (Signed)
Addended by: Wyn Forster R on: 08/04/2023 01:02 PM   Modules accepted: Orders

## 2023-08-04 NOTE — Progress Notes (Addendum)
SUBJECTIVE:  32 y.o. female complains of fluid filled blisters on right side of bottom lip that started yesterday. Reports tender to touch. Desires HSV testing and self swab to test for vaginal infections. Denies abnormal vaginal bleeding or significant pelvic pain or fever. No UTI symptoms. Denies history of known exposure to STD.  No LMP recorded.  There were no vitals taken for this visit.   OBJECTIVE:  She appears well, afebrile. Urine dipstick: not done.  CONSTITUTIONAL: Well-developed, well-nourished female in no acute distress.  HENT:  Normocephalic, atraumatic, External right and left ear normal. Oropharynx is clear and moist.  EYES: Conjunctivae and EOM are normal.  No scleral icterus.  NECK: Normal range of motion, supple, no masses.  Normal thyroid.  SKIN: Skin is warm and dry. No rash noted. Not diaphoretic. No erythema. No pallor. Fluid fills vesicles or erythematous base of right lower lip, unroofed and swabbed. NEUROLGIC: Alert and oriented to person, place, and time.  CARDIOVASCULAR: Normal heart rate noted RESPIRATORY: Effort and breath sounds normal, no problems with respiration noted. ABDOMEN: Soft, normal bowel sounds, no distention noted.  No tenderness, rebound or guarding.  MUSCULOSKELETAL: Normal range of motion. No tenderness.  No cyanosis, clubbing, or edema.   ASSESSMENT:  STD Testing Blister on Lip   PLAN:  In office provider assessed blisters on the patient and took a sample for testing. HSV, GC, chlamydia, trichomonas, BVAG, CVAG probe sent to lab. Treatment: To be determined once lab results are received ROV prn if symptoms persist or worsen. Provided pt a note to excuse from work today.  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  - MyChart message sent to pt.

## 2023-08-07 LAB — HERPES SIMPLEX VIRUS CULTURE

## 2023-08-08 LAB — CERVICOVAGINAL ANCILLARY ONLY
Bacterial Vaginitis (gardnerella): POSITIVE — AB
Candida Glabrata: NEGATIVE
Candida Vaginitis: NEGATIVE
Chlamydia: NEGATIVE
Comment: NEGATIVE
Comment: NEGATIVE
Comment: NEGATIVE
Comment: NORMAL
Comment: NEGATIVE
Comment: NEGATIVE
Neisseria Gonorrhea: NEGATIVE
Trichomonas: NEGATIVE

## 2023-08-08 MED ORDER — METRONIDAZOLE 500 MG PO TABS: 500.0000 mg | ORAL_TABLET | Freq: Two times a day (BID) | ORAL | 0 refills | Status: AC

## 2023-08-08 NOTE — Addendum Note (Signed)
Addended by: Barnet Pall on: 08/08/2023 09:04 PM   Modules accepted: Orders

## 2023-08-13 MED ORDER — VALACYCLOVIR HCL 500 MG PO TABS: 500.0000 mg | ORAL_TABLET | Freq: Every day | ORAL | 12 refills | Status: AC

## 2023-11-29 ENCOUNTER — Ambulatory Visit: Payer: Medicaid Other

## 2023-12-01 ENCOUNTER — Ambulatory Visit: Payer: Medicaid Other

## 2023-12-13 ENCOUNTER — Ambulatory Visit: Payer: Medicaid Other

## 2024-01-06 NOTE — Progress Notes (Deleted)
 NEW PATIENT Date of Service/Encounter:  01/06/24 Referring provider: none-self referred Primary care provider: Patient, No Pcp Per  Subjective:  Barbara Cox is a 33 y.o. female with a PMHx of *** presenting today for evaluation of *** History obtained from: chart review and {Persons; PED relatives w/patient:19415::"patient"}.   Discussed the use of AI scribe software for clinical note transcription with the patient, who gave verbal consent to proceed.  History of Present Illness            Chart Review:  Reviewed PCP notes from referral ***: ***  Other allergy screening: Asthma: {Blank single:19197::"yes","no"} Rhino conjunctivitis: {Blank single:19197::"yes","no"} Food allergy: {Blank single:19197::"yes","no"} Medication allergy: {Blank single:19197::"yes","no"} Hymenoptera allergy: {Blank single:19197::"yes","no"} Urticaria: {Blank single:19197::"yes","no"} Eczema:{Blank single:19197::"yes","no"} History of recurrent infections suggestive of immunodeficency: {Blank single:19197::"yes","no"} ***Vaccinations are up to date.   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 (Patient not taking: Reported on 08/04/2023) 30 tablet 5   metroNIDAZOLE (FLAGYL) 500 MG tablet Take 1 tablet (500 mg total) by mouth 2 (two) times daily. (Patient not taking: Reported on 08/04/2023) 14 tablet 0   Prenat-Fe Poly-Methfol-FA-DHA (VITAFOL ULTRA) 29-0.6-0.4-200 MG CAPS Take 1 capsule by mouth daily before breakfast. (Patient not taking: Reported on 08/04/2023) 30 capsule 11   valACYclovir (VALTREX) 1000 MG tablet Take 1 tablet (1,000 mg total) by mouth 2  (two) times daily. Take for ten days. 20 tablet 3   valACYclovir (VALTREX) 500 MG tablet Take 1 tablet (500 mg total) by mouth daily. Can increase to twice a day for 5 days in the event of a recurrence 30 tablet 12   Current Facility-Administered Medications  Medication Dose Route Frequency Provider Last Rate Last Admin   cefTRIAXone (ROCEPHIN) injection 500 mg  500 mg Intramuscular Once Brock Bad, MD       Known Allergies:  Allergies  Allergen Reactions   Peanuts [Peanut Oil] Anaphylaxis   Past Surgical History: Past Surgical History:  Procedure Laterality Date   LAPAROSCOPIC BILATERAL SALPINGECTOMY Bilateral 02/02/2022   Procedure: LAPAROSCOPIC BILATERAL SALPINGECTOMY;  Surgeon: Hermina Staggers, MD;  Location: [facility] OR;  Service: Gynecology;  Laterality: Bilateral;   NO PAST SURGERIES     Family History: Family History  Problem Relation Age of Onset   Leukemia Father    Breast cancer Sister    Social History: Barbara Cox lives ***.   ROS:  All other systems negative except as noted per HPI.  Objective:  There were no vitals taken for this visit. There is no height or weight on file to calculate BMI. Physical Exam:  General Appearance:  Alert, cooperative, no distress, appears stated age  Head:  Normocephalic, without obvious abnormality, atraumatic  Eyes:  Conjunctiva clear, EOM's intact  Ears {Blank multiple:19196:a:"***","EACs normal bilaterally","normal TMs bilaterally","ear tubes present bilaterally without exudate"}  Nose: Nares normal, {Blank multiple:19196:a:"***","hypertrophic turbinates","normal mucosa","no visible anterior polyps","septum midline"}  Throat: Lips, tongue normal; teeth and gums normal, {Blank multiple:19196:a:"***","normal posterior oropharynx","tonsils 2+","tonsils 3+","no tonsillar exudate","+ cobblestoning","surgically absent tonsils","mildly erythematous posterior oropharynx"}  Neck: Supple, symmetrical  Lungs:   {Blank  multiple:19196:a:"***","clear to auscultation bilaterally","end-expiratory wheezing","wheezing throughout"}, Respirations unlabored, {Blank multiple:19196:a:"***","no coughing","intermittent dry coughing","intermittent productive-sounding  cough"}  Heart:  {Blank multiple:19196:a:"***","regular rate and rhythm","no murmur"}, Appears well perfused  Extremities: No edema  Skin: {Blank multiple:19196:a:"***","erythematous, dry patches scattered on ***","lichenification on ***","Skin color, texture, turgor normal","no rashes or lesions on visualized portions of skin"}  Neurologic: No gross deficits   Diagnostics: Spirometry:  Tracings reviewed. Her effort: {Blank single:19197::"Good reproducible efforts.","It was hard to get consistent efforts and there is a question as to whether this reflects a maximal maneuver.","Poor effort, data can not be interpreted.","Variable effort-results affected","effort okay for first attempt at spirometry.","Results not reproducible due to ***"} FVC: ***L (pre), ***L  (post) FEV1: ***L, ***% predicted (pre), ***L, ***% predicted (post) FEV1/FVC ratio: *** (pre), *** (post) Interpretation: {Blank single:19197::"Spirometry consistent with mild obstructive disease","Spirometry consistent with moderate obstructive disease","Spirometry consistent with severe obstructive disease","Spirometry consistent with possible restrictive disease","Spirometry consistent with mixed obstructive and restrictive disease","Spirometry uninterpretable due to technique","Spirometry consistent with normal pattern","No overt abnormalities noted given today's efforts","Nonobstructive ratio, low FEV1","Nonobstructive ratio, low FEV1, possible restriction"}.  Please see scanned spirometry results for details.  Skin Testing: {Blank single:19197::"Select foods","Environmental allergy panel","Environmental allergy panel and select foods","Food allergy panel","None","Deferred due to recent antihistamines  use","deferred due to recent reaction","Pediatric Environmental Allergy Panel","Pediatric Food Panel","Select foods and environmental allergies"}. {Blank single:19197::"Adequate positive and negative controls","Inadequate positive control-testing invalid","Adequate positive and negative controls, dermatographism present, testing difficult to interpret"}. Results discussed with patient/family.   {Blank single:19197::"Allergy testing results were read and interpreted by myself, documented by clinical staff.","Allergy testing results were read by ***,FNP, documented by clinical staff"}  Labs:  Lab Orders  No laboratory test(s) ordered today     Assessment and Plan  Assessment and Plan               {Blank single:19197::"This note in its entirety was forwarded to the Provider who requested this consultation."}  Other: {Blank multiple:19196:a:"***","samples provided of: ***","school forms provided","reviewed spirometry technique","reviewed inhaler technique"}  Thank you for your kind referral. I appreciate the opportunity to take part in [facility] care. Please do not hesitate to contact me with questions.***  Sincerely,  Tonny Bollman, MD [facility]

## 2024-01-09 ENCOUNTER — Ambulatory Visit: Admitting: Internal Medicine

## 2024-08-30 ENCOUNTER — Other Ambulatory Visit: Payer: Self-pay

## 2024-08-30 MED ORDER — VALACYCLOVIR HCL 500 MG PO TABS: 500.0000 mg | ORAL_TABLET | Freq: Two times a day (BID) | ORAL | 0 refills | Status: AC

## 2024-08-30 NOTE — Progress Notes (Signed)
 Received request for refill on valtrex , sent per protocol.

## 2024-10-01 ENCOUNTER — Other Ambulatory Visit: Payer: Self-pay | Admitting: Obstetrics and Gynecology
